# Patient Record
Sex: Male | Born: 1970 | Race: Black or African American | Hispanic: No | Marital: Single | Smoking: Former smoker
Health system: Southern US, Community
[De-identification: ages and names within clinical notes are randomized; demographics above are authoritative.]

## PROBLEM LIST (undated history)

## (undated) ENCOUNTER — Emergency Department (HOSPITAL_COMMUNITY): Discharge status: Absent without leave | Payer: Self-pay

## (undated) ENCOUNTER — Emergency Department (HOSPITAL_COMMUNITY): Admission: EM | Payer: Self-pay | Source: Home / Self Care

## (undated) DIAGNOSIS — Z720 Tobacco use: Secondary | ICD-10-CM

## (undated) DIAGNOSIS — I219 Acute myocardial infarction, unspecified: Secondary | ICD-10-CM

## (undated) HISTORY — DX: Acute myocardial infarction, unspecified: I21.9

---

## 2004-04-25 ENCOUNTER — Emergency Department (HOSPITAL_COMMUNITY): Admission: EM | Admit: 2004-04-25 | Discharge: 2004-04-25 | Payer: Self-pay | Admitting: Emergency Medicine

## 2011-09-27 ENCOUNTER — Emergency Department (HOSPITAL_COMMUNITY)
Admission: EM | Admit: 2011-09-27 | Discharge: 2011-09-27 | Disposition: A | Discharge status: Home / Self Care | Payer: Self-pay | Attending: Emergency Medicine | Admitting: Emergency Medicine

## 2011-09-27 ENCOUNTER — Emergency Department (HOSPITAL_COMMUNITY): Payer: Self-pay

## 2011-09-27 DIAGNOSIS — M79609 Pain in unspecified limb: Secondary | ICD-10-CM | POA: Insufficient documentation

## 2011-09-27 DIAGNOSIS — M25579 Pain in unspecified ankle and joints of unspecified foot: Secondary | ICD-10-CM | POA: Insufficient documentation

## 2011-09-27 DIAGNOSIS — S93409A Sprain of unspecified ligament of unspecified ankle, initial encounter: Secondary | ICD-10-CM | POA: Insufficient documentation

## 2011-09-27 DIAGNOSIS — M25473 Effusion, unspecified ankle: Secondary | ICD-10-CM | POA: Insufficient documentation

## 2011-09-27 DIAGNOSIS — M25476 Effusion, unspecified foot: Secondary | ICD-10-CM | POA: Insufficient documentation

## 2011-09-27 DIAGNOSIS — X500XXA Overexertion from strenuous movement or load, initial encounter: Secondary | ICD-10-CM | POA: Insufficient documentation

## 2012-01-03 ENCOUNTER — Emergency Department (INDEPENDENT_AMBULATORY_CARE_PROVIDER_SITE_OTHER)
Admission: EM | Admit: 2012-01-03 | Discharge: 2012-01-03 | Disposition: A | Discharge status: Home / Self Care | Payer: BC Managed Care – PPO | Source: Home / Self Care | Attending: Family Medicine | Admitting: Family Medicine

## 2012-01-03 ENCOUNTER — Encounter (HOSPITAL_COMMUNITY): Payer: Self-pay | Admitting: *Deleted

## 2012-01-03 DIAGNOSIS — R05 Cough: Secondary | ICD-10-CM

## 2012-01-03 DIAGNOSIS — J310 Chronic rhinitis: Secondary | ICD-10-CM

## 2012-01-03 MED ORDER — GUAIFENESIN-CODEINE 100-10 MG/5ML PO SYRP: 5.0000 mL | ORAL_SOLUTION | Freq: Four times a day (QID) | ORAL | Status: AC | PRN

## 2012-01-03 MED ORDER — FLUTICASONE PROPIONATE 50 MCG/ACT NA SUSP: 2.0000 | Freq: Every day | NASAL | Status: DC

## 2012-01-03 NOTE — ED Provider Notes (Signed)
History     CSN: [id number]  Arrival date & time 01/03/12  0909   First MD Initiated Contact with Patient 01/03/12 [phone]      Chief Complaint  Patient presents with  . URI    (Consider location/radiation/quality/duration/timing/severity/associated sxs/prior treatment) HPI Comments: Jack Salas presents for evaluation of 3 weeks of cough, nasal congestion, and sore throat. He reports trying over-the-counter remedies without much relief. He denies any fever. He denies any history of seasonal allergies. He reports that the cough is worse at night.  Patient is a 41 y.o. male presenting with cough. The history is provided by the patient.  Cough This is a recurrent problem. The current episode started more than 1 week ago. The problem occurs constantly. The problem has not changed since onset.The cough is non-productive. There has been no fever. Associated symptoms include rhinorrhea and sore throat. He has tried decongestants and cough syrup for the symptoms. The treatment provided no relief.    History reviewed. No pertinent past medical history.  History reviewed. No pertinent past surgical history.  No family history on file.  History  Substance Use Topics  . Smoking status: Current Everyday Smoker  . Smokeless tobacco: Not on file  . Alcohol Use: Yes     occassional      Review of Systems  Constitutional: Negative.   HENT: Positive for sore throat and rhinorrhea.   Eyes: Negative.   Respiratory: Positive for cough.   Cardiovascular: Negative.   Gastrointestinal: Negative.   Genitourinary: Negative.   Musculoskeletal: Negative.   Skin: Negative.   Neurological: Negative.     Allergies  Review of patient's allergies indicates no known allergies.  Home Medications   Current Outpatient Rx  Name Route Sig Dispense Refill  . FLUTICASONE PROPIONATE 50 MCG/ACT NA SUSP Nasal Place 2 sprays into the nose daily. 16 g 2  . GUAIFENESIN-CODEINE 100-10 MG/5ML PO SYRP Oral Take 5 mLs  by mouth every 6 (six) hours as needed for cough or congestion. 120 mL 0    BP 130/82  Pulse 75  Temp(Src) 98.2 F (36.8 C) (Oral)  Resp 16  SpO2 99%  Physical Exam  Nursing note and vitals reviewed. Constitutional: He is oriented to person, place, and time. He appears well-developed and well-nourished.  HENT:  Head: Normocephalic and atraumatic.  Right Ear: Tympanic membrane normal.  Left Ear: Tympanic membrane normal.  Mouth/Throat: Uvula is midline, oropharynx is clear and moist and mucous membranes are normal.  Eyes: EOM are normal.  Neck: Normal range of motion.  Pulmonary/Chest: Effort normal and breath sounds normal. He has no wheezes. He has no rhonchi.  Musculoskeletal: Normal range of motion.  Neurological: He is alert and oriented to person, place, and time.  Skin: Skin is warm and dry.  Psychiatric: His behavior is normal.    ED Course  Procedures (including critical care time)  Labs Reviewed - No data to display No results found.   1. Cough   2. Rhinitis       MDM  rx given for fluticasone and guaifenesin AC        Richardo Priest, MD 01/03/12 1024

## 2012-01-03 NOTE — ED Notes (Signed)
Pt c/o sinus congestion, headache, cough for 3 weeks.  Has tried different otc meds without relief.  Denies fever.

## 2013-11-13 ENCOUNTER — Encounter (HOSPITAL_COMMUNITY): Payer: Self-pay | Admitting: Emergency Medicine

## 2013-11-13 ENCOUNTER — Emergency Department (HOSPITAL_COMMUNITY)
Admission: EM | Admit: 2013-11-13 | Discharge: 2013-11-13 | Disposition: A | Discharge status: Home / Self Care | Payer: BC Managed Care – PPO | Attending: Emergency Medicine | Admitting: Emergency Medicine

## 2013-11-13 DIAGNOSIS — H01009 Unspecified blepharitis unspecified eye, unspecified eyelid: Secondary | ICD-10-CM | POA: Insufficient documentation

## 2013-11-13 DIAGNOSIS — H01006 Unspecified blepharitis left eye, unspecified eyelid: Secondary | ICD-10-CM

## 2013-11-13 DIAGNOSIS — F17200 Nicotine dependence, unspecified, uncomplicated: Secondary | ICD-10-CM | POA: Insufficient documentation

## 2013-11-13 MED ORDER — ERYTHROMYCIN 5 MG/GM OP OINT: TOPICAL_OINTMENT | OPHTHALMIC | Status: DC

## 2013-11-13 NOTE — Progress Notes (Signed)
[facility] provided pt with a list of primary care resources. Patient stated that he was pending insurance through his job.  °

## 2013-11-13 NOTE — ED Notes (Signed)
Pt states he has had L eyelid swelling for past 2 days. Denies blurred vision. States he does have eye drainage.

## 2013-11-13 NOTE — ED Provider Notes (Signed)
CSN: [id number]     Arrival date & time 11/13/13  1041 History   First MD Initiated Contact with Patient 11/13/13 1108     Chief Complaint  Patient presents with  . Facial Swelling   (Consider location/radiation/quality/duration/timing/severity/associated sxs/prior Treatment) HPI Pt reports itching and swelling of his left eyelid x 2 days.  He remembers rubbing his eyelid at one point but denies any other trauma.  Has tried warm and cool compresses without improvement.  Denies fevers, chills, body aches, eye pain, visual changes.  Denies any trauma to the eye or entry of FB.  Denies FB sensation.  Denies pain or tenderness of the eyelid.   History reviewed. No pertinent past medical history. History reviewed. No pertinent past surgical history. History reviewed. No pertinent family history. History  Substance Use Topics  . Smoking status: Current Every Day Smoker  . Smokeless tobacco: Not on file  . Alcohol Use: Yes     Comment: occassional    Review of Systems  Constitutional: Negative for fever and chills.  Eyes: Positive for itching (eyelid itching only). Negative for photophobia, pain, discharge, redness and visual disturbance.  Musculoskeletal: Negative for myalgias.  Skin: Negative for rash and wound.  Allergic/Immunologic: Negative for immunocompromised state.    Allergies  Review of patient's allergies indicates no known allergies.  Home Medications   Current Outpatient Rx  Name  Route  Sig  Dispense  Refill  . tetrahydrozoline (VISINE) 0.05 % ophthalmic solution   Left Eye   Place 2 drops into the left eye once.          BP 137/85  Pulse 84  Temp(Src) 98.2 F (36.8 C) (Oral)  Resp 20  SpO2 99% Physical Exam  Nursing note and vitals reviewed. Constitutional: He appears well-developed and well-nourished. No distress.  HENT:  Head: Normocephalic and atraumatic.  Eyes: Conjunctivae and EOM are normal. Pupils are equal, round, and reactive to light. Right  eye exhibits no discharge and no exudate. Left eye exhibits no discharge, no exudate and no hordeolum. Right conjunctiva is not injected. Right conjunctiva has no hemorrhage. Left conjunctiva is not injected. Left conjunctiva has no hemorrhage. No scleral icterus.  Left eye with mild edema, erythema along lid margin/base of the eyelashes.  Nontender, no discharge.  No masses.    Neck: Neck supple.  Pulmonary/Chest: Effort normal.  Neurological: He is alert.  Skin: He is not diaphoretic.    ED Course  Procedures (including critical care time) Labs Review Labs Reviewed - No data to display Imaging Review No results found.  EKG Interpretation   None       MDM   1. Blepharitis of left eye    Pt with mild swelling and itching to left eyelid without any complaints involving the eye (globe) itself.  No visual changes, no trauma to the eye.  The eyelid is not tender.  Pt is afebrile, nontoxic.  He has no systemic symptoms or URI-type complaints.  No e/o stye on exam.  D/C home with erythromycine ophth. Ointment, instructions for warm compresses.  Ophthalmology f/u given in the event of lack of improvement.  Discussed return precautions. Discussed findings, treatment, and follow up  with patient.  Pt given return precautions.  Pt verbalizes understanding and agrees with plan.      I doubt any other EMC precluding discharge at this time including, but not necessarily limited to the following: orbital or periorbital cellulitis, corneal abrasion, corneal FB, ruptured globe.  Trixie Dredge, PA-C 11/13/13 1148

## 2013-11-13 NOTE — ED Notes (Signed)
Ambulates without distress. alertx4.

## 2013-11-14 NOTE — ED Provider Notes (Signed)
Medical screening examination/treatment/procedure(s) were performed by non-physician practitioner and as supervising physician I was immediately available for consultation/collaboration.  EKG Interpretation   None          Christopher J. Pollina, MD 11/14/13 1403 

## 2014-10-18 ENCOUNTER — Inpatient Hospital Stay (HOSPITAL_COMMUNITY)
Admission: EM | Admit: 2014-10-18 | Discharge: 2014-10-20 | DRG: 247 | Disposition: A | Discharge status: Home / Self Care | Payer: BC Managed Care – PPO | Attending: Cardiology | Admitting: Cardiology

## 2014-10-18 ENCOUNTER — Emergency Department (HOSPITAL_COMMUNITY): Payer: BC Managed Care – PPO

## 2014-10-18 ENCOUNTER — Encounter (HOSPITAL_COMMUNITY)
Admission: EM | Disposition: A | Discharge status: Home / Self Care | Payer: BC Managed Care – PPO | Source: Home / Self Care | Attending: Cardiology

## 2014-10-18 ENCOUNTER — Encounter (HOSPITAL_COMMUNITY): Payer: Self-pay

## 2014-10-18 DIAGNOSIS — I249 Acute ischemic heart disease, unspecified: Secondary | ICD-10-CM | POA: Diagnosis present

## 2014-10-18 DIAGNOSIS — R0602 Shortness of breath: Secondary | ICD-10-CM

## 2014-10-18 DIAGNOSIS — Z8249 Family history of ischemic heart disease and other diseases of the circulatory system: Secondary | ICD-10-CM

## 2014-10-18 DIAGNOSIS — I252 Old myocardial infarction: Secondary | ICD-10-CM | POA: Diagnosis present

## 2014-10-18 DIAGNOSIS — Z955 Presence of coronary angioplasty implant and graft: Secondary | ICD-10-CM

## 2014-10-18 DIAGNOSIS — I2119 ST elevation (STEMI) myocardial infarction involving other coronary artery of inferior wall: Secondary | ICD-10-CM

## 2014-10-18 DIAGNOSIS — Z720 Tobacco use: Secondary | ICD-10-CM

## 2014-10-18 DIAGNOSIS — R079 Chest pain, unspecified: Secondary | ICD-10-CM

## 2014-10-18 DIAGNOSIS — F1210 Cannabis abuse, uncomplicated: Secondary | ICD-10-CM | POA: Diagnosis present

## 2014-10-18 DIAGNOSIS — E780 Pure hypercholesterolemia: Secondary | ICD-10-CM | POA: Diagnosis present

## 2014-10-18 DIAGNOSIS — I2109 ST elevation (STEMI) myocardial infarction involving other coronary artery of anterior wall: Secondary | ICD-10-CM | POA: Diagnosis present

## 2014-10-18 DIAGNOSIS — I10 Essential (primary) hypertension: Secondary | ICD-10-CM | POA: Diagnosis present

## 2014-10-18 HISTORY — PX: PERCUTANEOUS CORONARY STENT INTERVENTION (PCI-S): SHX5485

## 2014-10-18 HISTORY — PX: LEFT HEART CATHETERIZATION WITH CORONARY ANGIOGRAM: SHX5451

## 2014-10-18 LAB — APTT: APTT: 26 s (ref 24–37)

## 2014-10-18 LAB — BASIC METABOLIC PANEL
ANION GAP: 14 (ref 5–15)
BUN: 9 mg/dL (ref 6–23)
CHLORIDE: 104 meq/L (ref 96–112)
CO2: 24 mEq/L (ref 19–32)
Calcium: 9 mg/dL (ref 8.4–10.5)
Creatinine, Ser: 1.02 mg/dL (ref 0.50–1.35)
GFR calc Af Amer: >90 mL/min (ref >90)
GFR calc non Af Amer: 88 mL/min — ABNORMAL LOW (ref >90)
GLUCOSE: 146 mg/dL — AB (ref 70–99)
POTASSIUM: 4 meq/L (ref 3.7–5.3)
SODIUM: 142 meq/L (ref 137–147)

## 2014-10-18 LAB — CBC
HEMATOCRIT: 38 % — AB (ref 39.0–52.0)
Hemoglobin: 12.4 g/dL — ABNORMAL LOW (ref 13.0–17.0)
MCH: 24.2 pg — ABNORMAL LOW (ref 26.0–34.0)
MCHC: 32.6 g/dL (ref 30.0–36.0)
MCV: 74.1 fL — AB (ref 78.0–100.0)
Platelets: 347 10*3/uL (ref 150–400)
RBC: 5.13 MIL/uL (ref 4.22–5.81)
RDW: 14.2 % (ref 11.5–15.5)
WBC: 14.1 10*3/uL — ABNORMAL HIGH (ref 4.0–10.5)

## 2014-10-18 LAB — PRO B NATRIURETIC PEPTIDE: PRO B NATRI PEPTIDE: 33.8 pg/mL (ref 0–125)

## 2014-10-18 LAB — I-STAT TROPONIN, ED: Troponin i, poc: 0 ng/mL (ref 0.00–0.08)

## 2014-10-18 LAB — PROTIME-INR
INR: 0.97 (ref 0.00–1.49)
Prothrombin Time: 13 seconds (ref 11.6–15.2)

## 2014-10-18 SURGERY — LEFT HEART CATHETERIZATION WITH CORONARY ANGIOGRAM
Anesthesia: LOCAL

## 2014-10-18 MED ORDER — NITROGLYCERIN IN D5W 200-5 MCG/ML-% IV SOLN: 5.0000 ug/min | INTRAVENOUS | Status: DC

## 2014-10-18 MED ORDER — SODIUM CHLORIDE 0.9 % IV SOLN
INTRAVENOUS | Status: AC
  Administered 2014-10-18 – 2014-10-19 (×3): via INTRAVENOUS

## 2014-10-18 MED ORDER — BIVALIRUDIN 250 MG IV SOLR
INTRAVENOUS | Status: AC
  Filled 2014-10-18: qty 250

## 2014-10-18 MED ORDER — ASPIRIN 81 MG PO CHEW: 324.0000 mg | CHEWABLE_TABLET | ORAL | Status: AC

## 2014-10-18 MED ORDER — HEPARIN (PORCINE) IN NACL 100-0.45 UNIT/ML-% IJ SOLN
1300.0000 [IU]/h | INTRAMUSCULAR | Status: DC
  Filled 2014-10-18: qty 250

## 2014-10-18 MED ORDER — NITROGLYCERIN 0.4 MG SL SUBL: 0.4000 mg | SUBLINGUAL_TABLET | SUBLINGUAL | Status: DC | PRN

## 2014-10-18 MED ORDER — ASPIRIN 81 MG PO CHEW
324.0000 mg | CHEWABLE_TABLET | Freq: Once | ORAL | Status: AC
  Administered 2014-10-18: 324 mg via ORAL
  Filled 2014-10-18: qty 4

## 2014-10-18 MED ORDER — ONDANSETRON HCL 4 MG/2ML IJ SOLN: 4.0000 mg | Freq: Four times a day (QID) | INTRAMUSCULAR | Status: DC | PRN

## 2014-10-18 MED ORDER — ATORVASTATIN CALCIUM 80 MG PO TABS
80.0000 mg | ORAL_TABLET | Freq: Every day | ORAL | Status: DC
  Administered 2014-10-18 – 2014-10-19 (×2): 80 mg via ORAL
  Filled 2014-10-18 (×3): qty 1

## 2014-10-18 MED ORDER — PRASUGREL HCL 10 MG PO TABS
ORAL_TABLET | ORAL | Status: AC
  Administered 2014-10-19: 10 mg via ORAL
  Filled 2014-10-18: qty 6

## 2014-10-18 MED ORDER — NITROGLYCERIN IN D5W 200-5 MCG/ML-% IV SOLN
5.0000 ug/min | INTRAVENOUS | Status: DC
  Administered 2014-10-18: 15 ug/min via INTRAVENOUS

## 2014-10-18 MED ORDER — NITROGLYCERIN 1 MG/10 ML FOR IR/CATH LAB
INTRA_ARTERIAL | Status: AC
Start: 2014-10-18 — End: 2014-10-18
  Filled 2014-10-18: qty 10

## 2014-10-18 MED ORDER — FENTANYL CITRATE 0.05 MG/ML IJ SOLN
INTRAMUSCULAR | Status: AC
  Filled 2014-10-18: qty 2

## 2014-10-18 MED ORDER — ACETAMINOPHEN 325 MG PO TABS: 650.0000 mg | ORAL_TABLET | ORAL | Status: DC | PRN

## 2014-10-18 MED ORDER — ASPIRIN EC 81 MG PO TBEC
81.0000 mg | DELAYED_RELEASE_TABLET | Freq: Every day | ORAL | Status: DC
  Administered 2014-10-19 – 2014-10-20 (×2): 81 mg via ORAL
  Filled 2014-10-18 (×2): qty 1

## 2014-10-18 MED ORDER — TIROFIBAN HCL IV 5 MG/100ML
0.1500 ug/kg/min | INTRAVENOUS | Status: DC
  Administered 2014-10-18 – 2014-10-19 (×2): 0.15 ug/kg/min via INTRAVENOUS
  Filled 2014-10-18 (×3): qty 100

## 2014-10-18 MED ORDER — PRASUGREL HCL 10 MG PO TABS
10.0000 mg | ORAL_TABLET | Freq: Every day | ORAL | Status: DC
  Administered 2014-10-19 – 2014-10-20 (×2): 10 mg via ORAL
  Filled 2014-10-18 (×2): qty 1

## 2014-10-18 MED ORDER — HEPARIN SODIUM (PORCINE) 5000 UNIT/ML IJ SOLN: 60.0000 [IU]/kg | INTRAMUSCULAR | Status: DC

## 2014-10-18 MED ORDER — ASPIRIN 300 MG RE SUPP
300.0000 mg | RECTAL | Status: AC
  Filled 2014-10-18: qty 1

## 2014-10-18 MED ORDER — TIROFIBAN HCL IV 12.5 MG/250 ML
INTRAVENOUS | Status: AC
  Filled 2014-10-18: qty 250

## 2014-10-18 MED ORDER — LIDOCAINE HCL (PF) 1 % IJ SOLN
INTRAMUSCULAR | Status: AC
  Filled 2014-10-18: qty 30

## 2014-10-18 MED ORDER — MORPHINE SULFATE 4 MG/ML IJ SOLN
4.0000 mg | Freq: Once | INTRAMUSCULAR | Status: AC
  Administered 2014-10-18: 4 mg via INTRAVENOUS
  Filled 2014-10-18: qty 1

## 2014-10-18 MED ORDER — PNEUMOCOCCAL VAC POLYVALENT 25 MCG/0.5ML IJ INJ
0.5000 mL | INJECTION | INTRAMUSCULAR | Status: DC
  Filled 2014-10-18: qty 0.5

## 2014-10-18 MED ORDER — NITROGLYCERIN IN D5W 200-5 MCG/ML-% IV SOLN
INTRAVENOUS | Status: AC
  Filled 2014-10-18: qty 250

## 2014-10-18 MED ORDER — SODIUM CHLORIDE 0.9 % IV SOLN
INTRAVENOUS | Status: DC
  Administered 2014-10-18: 20:00:00 via INTRAVENOUS

## 2014-10-18 MED ORDER — METOPROLOL TARTRATE 12.5 MG HALF TABLET
12.5000 mg | ORAL_TABLET | Freq: Two times a day (BID) | ORAL | Status: DC
  Administered 2014-10-18 – 2014-10-20 (×4): 12.5 mg via ORAL
  Filled 2014-10-18 (×5): qty 1

## 2014-10-18 MED ORDER — INFLUENZA VAC SPLIT QUAD 0.5 ML IM SUSY
0.5000 mL | PREFILLED_SYRINGE | INTRAMUSCULAR | Status: AC
  Administered 2014-10-19: 0.5 mL via INTRAMUSCULAR
  Filled 2014-10-18: qty 0.5

## 2014-10-18 MED ORDER — ALPRAZOLAM 0.25 MG PO TABS
0.2500 mg | ORAL_TABLET | Freq: Three times a day (TID) | ORAL | Status: DC | PRN
  Administered 2014-10-19 (×2): 0.25 mg via ORAL
  Filled 2014-10-18 (×2): qty 1

## 2014-10-18 MED ORDER — FENTANYL CITRATE 0.05 MG/ML IJ SOLN
100.0000 ug | Freq: Once | INTRAMUSCULAR | Status: AC
  Administered 2014-10-18: 100 ug via INTRAVENOUS

## 2014-10-18 MED ORDER — ASPIRIN 81 MG PO CHEW
81.0000 mg | CHEWABLE_TABLET | Freq: Every day | ORAL | Status: DC
  Administered 2014-10-20: 81 mg via ORAL
  Filled 2014-10-18: qty 1

## 2014-10-18 MED ORDER — HEPARIN BOLUS VIA INFUSION
4000.0000 [IU] | Freq: Once | INTRAVENOUS | Status: DC
  Filled 2014-10-18: qty 4000

## 2014-10-18 MED ORDER — HEPARIN (PORCINE) IN NACL 2-0.9 UNIT/ML-% IJ SOLN
INTRAMUSCULAR | Status: AC
  Filled 2014-10-18: qty 1500

## 2014-10-18 NOTE — Progress Notes (Signed)
Chaplain provided support to family by escorting them to waiting area outside Cath Lab.  Stemi team informed of family location.  Emotional and spiritual support provided.  Chaplain will follow up as needed.   10/18/14 2100  Clinical Encounter Type  Visited With Family;Patient not available  Visit Type Initial;Spiritual support;Social support;Patient in surgery;Critical Care  Referral From Care management  Spiritual Encounters  Spiritual Needs Emotional  Stress Factors  Patient Stress Factors None identified  Family Stress Factors Exhausted;Health changes  Erroll LunaOvercash, Leobardo Granlund A, Chaplain

## 2014-10-18 NOTE — H&P (Signed)
Jack Salas is an 43 y.o. male.   Chief Complaint: Chest pain  HPI: Patient is 43 year old male with no significant past medical history except for tobacco abuse, marijuana abuse and strong family history of coronary artery disease father died of massive MI at the age of 81., Came to ER by family at [facility] complaining of retrosternal chest pain described as pressure tightness radiating to right shoulder right arm associated with diaphoresis shortness of breath nausea grade 8/10 while watching football game. EKG done in the ER showed normal sinus rhythm with septal Q waves and ST elevation concave upward in lead 23 aVF V5 and V6 without significant reciprocal changes "STEMI was called and patient was transferred to [facility] cath lab. Patient does give history of exertional chest pain while at work while lifting boxes relieves with rest for last few days. Patient did not seek any medical attention in the past. Patient denies any cocaine abuse. Patient received a IV heparin and aspirin 8 mg IV morphine with partial relief of chest pain and was transferred emergently to [facility].  History reviewed. No pertinent past medical history.  History reviewed. No pertinent past surgical history.  No family history on file. Social History:  reports that he has been smoking.  He does not have any smokeless tobacco history on file. He reports that he drinks alcohol. He reports that he does not use illicit drugs.  Allergies: No Known Allergies  Medications Prior to Admission  Medication Sig Dispense Refill  . erythromycin ophthalmic ointment Place a 1/2 inch ribbon of ointment into the lower eyelid four times daily for 2 weeks, then once qHS for 4-8 weeks. 3.5 g 0  . tetrahydrozoline (VISINE) 0.05 % ophthalmic solution Place 2 drops into the left eye once.      Results for orders placed or performed during the hospital encounter of 10/18/14 (from the past 48 hour(s))  CBC      Status: Abnormal   Collection Time: 10/18/14  8:08 PM  Result Value Ref Range   WBC 14.1 (H) 4.0 - 10.5 K/uL   RBC 5.13 4.22 - 5.81 MIL/uL   Hemoglobin 12.4 (L) 13.0 - 17.0 g/dL   HCT 38.0 (L) 39.0 - 52.0 %   MCV 74.1 (L) 78.0 - 100.0 fL   MCH 24.2 (L) 26.0 - 34.0 pg   MCHC 32.6 30.0 - 36.0 g/dL   RDW 14.2 11.5 - 15.5 %   Platelets 347 150 - 400 K/uL  Basic metabolic panel     Status: Abnormal   Collection Time: 10/18/14  8:08 PM  Result Value Ref Range   Sodium 142 137 - 147 mEq/L   Potassium 4.0 3.7 - 5.3 mEq/L   Chloride 104 96 - 112 mEq/L   CO2 24 19 - 32 mEq/L   Glucose, Bld 146 (H) 70 - 99 mg/dL   BUN 9 6 - 23 mg/dL   Creatinine, Ser 1.02 0.50 - 1.35 mg/dL   Calcium 9.0 8.4 - 10.5 mg/dL   GFR calc non Af Amer 88 (L) >90 mL/min   GFR calc Af Amer >90 >90 mL/min    Comment: (NOTE) The eGFR has been calculated using the CKD EPI equation. This calculation has not been validated in all clinical situations. eGFR's persistently <90 mL/min signify possible Chronic Kidney Disease.    Anion gap 14 5 - 15  Pro b natriuretic peptide (BNP)     Status: None   Collection Time: 10/18/14  8:08 PM  Result Value Ref Range   Pro B Natriuretic peptide (BNP) 33.8 0 - 125 pg/mL  APTT     Status: None   Collection Time: 10/18/14  8:09 PM  Result Value Ref Range   aPTT 26 24 - 37 seconds  Protime-INR     Status: None   Collection Time: 10/18/14  8:09 PM  Result Value Ref Range   Prothrombin Time 13.0 11.6 - 15.2 seconds   INR 0.97 0.00 - 1.49  I-stat troponin, ED (not at [facility])     Status: None   Collection Time: 10/18/14  8:17 PM  Result Value Ref Range   Troponin i, poc 0.00 0.00 - 0.08 ng/mL   Comment 3            Comment: Due to the release kinetics of cTnI, a negative result within the first hours of the onset of symptoms does not rule out myocardial infarction with certainty. If myocardial infarction is still suspected, repeat the test at appropriate intervals.    Dg Chest  Port 1 View  10/18/2014   CLINICAL DATA:  Centralized chest pain beginning today, associated with shortness of breath. Intermittent symptoms for 1 month.  EXAM: PORTABLE CHEST - 1 VIEW  COMPARISON:  None.  FINDINGS: Cardiomediastinal silhouette is unremarkable. Mild prominence of central bronchovascular markings without pleural effusions or focal consolidations. No pneumothorax. Soft tissue planes and included osseous structures are unremarkable.  IMPRESSION: Mild prominence of central bronchovascular markings can be seen with reactive airway disease or bronchitis without focal consolidation.   Electronically Signed   By: Elon Alas   On: 10/18/2014 20:33    Review of Systems  Constitutional: Positive for diaphoresis. Negative for fever, chills and weight loss.  Eyes: Negative for blurred vision, double vision, photophobia and pain.  Respiratory: Positive for shortness of breath. Negative for cough, hemoptysis and sputum production.   Cardiovascular: Positive for chest pain. Negative for palpitations, orthopnea, claudication, leg swelling and PND.  Gastrointestinal: Positive for nausea. Negative for heartburn, diarrhea and constipation.  Genitourinary: Positive for dysuria. Negative for urgency.  Neurological: Positive for headaches. Negative for dizziness.    Blood pressure 161/98, pulse 79, temperature 97.5 F (36.4 C), temperature source Oral, resp. rate 19, height $RemoveBe'5\' 11"'rgXvnuRwV$  (1.803 m), weight 113.399 kg (250 lb), SpO2 100 %. Physical Exam  Constitutional: He is oriented to person, place, and time.  HENT:  Head: Normocephalic and atraumatic.  Eyes: Conjunctivae and EOM are normal.  Neck: Neck supple. No JVD present. No tracheal deviation present. No thyromegaly present.  Cardiovascular: Normal rate and regular rhythm.   Murmur (Soft systolic murmur and S4 gallop noted) heard. Respiratory: Effort normal and breath sounds normal. No respiratory distress. He has no wheezes. He has no  rales.  GI: Soft. Bowel sounds are normal. He exhibits no distension. There is no tenderness. There is no rebound.  Musculoskeletal: He exhibits no edema or tenderness.  Neurological: He is alert and oriented to person, place, and time.     Assessment/Plan Acute coronary syndrome rule out MI New-onset hypertension Tobacco abuse Marijuana abuse Positive family history of coronary artery disease Plan Discussed with patient briefly regarding emergency left cath possible PTCA stenting this risk and benefits and consents for PCI  [facility] 10/18/2014, 10:07 PM

## 2014-10-18 NOTE — ED Notes (Signed)
Belongings given to spouse

## 2014-10-18 NOTE — ED Provider Notes (Signed)
Await cath lab arrival; patient with ongoing moderately severe chest pressure not sharp stabbing pain not relieved with heparin drip after heparin bolus and aspirin as well as morphine prior to arrival; fentanyl ordered in ED; repeat ECG reveals slight persistent ST elevation inferior leads without reciprocal ST depression. 2050  Jack HornJohn M Jaira Canady, MD 10/22/14 [phone]

## 2014-10-18 NOTE — Progress Notes (Signed)
ANTICOAGULATION CONSULT NOTE - Initial Consult  Pharmacy Consult for heparin Indication: chest pain/ACS  No Known Allergies  Patient Measurements: Height: 5\' 11"  (180.3 cm) Weight: 250 lb (113.399 kg) IBW/kg (Calculated) : 75.3 Heparin Dosing Weight: 99 kg   Vital Signs: Temp: 97.5 F (36.4 C) (11/22 1952) Temp Source: Oral (11/22 1952) BP: 150/97 mmHg (11/22 1952) Pulse Rate: 79 (11/22 1952)  Labs: No results for input(s): HGB, HCT, PLT, APTT, LABPROT, INR, HEPARINUNFRC, CREATININE, CKTOTAL, CKMB, TROPONINI in the last 72 hours.  CrCl cannot be calculated (Patient has no serum creatinine result on file.).   Medical History: History reviewed. No pertinent past medical history.  Medications:   (Not in a hospital admission)  Assessment: Pharmacy consulted to start heparin on [age] YOM who presented with chest pain earlier today now ruled as having STEMI. Awaiting emergent cath. He is not on any anticoagulants prior to admission.   Goal of Therapy:  Heparin level 0.3-0.7 units/ml Monitor platelets by anticoagulation protocol: Yes   Plan:  -Give heparin 4000 units bolus followed by heparin 1300 units/hr -F/u after cath  -Monitor daily HL, CBC and s/s of bleeding  Vinnie LevelBenjamin Hubbert Landrigan, PharmD.  Clinical Pharmacist Pager [phone]

## 2014-10-18 NOTE — ED Notes (Signed)
Tiffany, RN gave HEPARIN to [facility].

## 2014-10-18 NOTE — ED Provider Notes (Signed)
Pt seen with H. Muthersbaugh, PA.  C/o SSCP starting 1 hour ago. Diaphoresis.  Smoker. Father died of MI at 44 y/o.  Pt awake.  HR 89bpm, sinus, BP 150/.  EKG with STE II,III, F, and STE with Hyperacute T V4-V6. D/W Dr. Sharyn LullHarwani, Cardiology, and Dr. Fonnie JarvisBednar (ED).  Given ASA, Heparin bolus, emergent transfer arranged.     Rolland PorterMark Vincen Bejar, MD 10/18/14 2024

## 2014-10-18 NOTE — ED Provider Notes (Signed)
CSN: [id number]     Arrival date & time 10/18/14  1945 History   First MD Initiated Contact with Patient 10/18/14 1958     Chief Complaint  Patient presents with  . Chest Pain     (Consider location/radiation/quality/duration/timing/severity/associated sxs/prior Treatment) Patient is a 43 y.o. male presenting with chest pain. The history is provided by the patient, the spouse and medical records. No language interpreter was used.  Chest Pain Associated symptoms: diaphoresis and shortness of breath   Associated symptoms: no abdominal pain, no back pain, no cough, no fatigue, no fever, no headache, no nausea and not vomiting     Glendale ChardObra Baumler is a 43 y.o. male  with no known medical history or allergies presents to the Emergency Department complaining of gradual, persistent, progressively worsening substernal chest pain with associated dizziness and diaphoresis beginning approximately 7:15 tonight while watching a football game. Patient reports he is in everyday smoker of approximately 5 cigarettes. He states marijuana usage but denies cocaine usage. He drinks occasional alcohol and had 0.5 beers tonight.  Patient reports he developed a headache, became dizzy and walk to the bathroom where he had diaphoresis and chest pain. Patient scheduled for chest pain as aching and heavy. He reports that it radiates to his back and his right arm feels "strange."   Patient denies radiation of pain to his jaw or left arm. Patient also reports nausea without vomiting. His no history of MI, hypertension, diabetes, or known high cholesterol.  Patient reports he's been intermittently having shortness of breath and chest pain for approximately one month.  History reviewed. No pertinent past medical history. History reviewed. No pertinent past surgical history. No family history on file. History  Substance Use Topics  . Smoking status: Current Every Day Smoker  . Smokeless tobacco: Not on file  . Alcohol Use: Yes      Comment: occassional    Review of Systems  Constitutional: Positive for diaphoresis. Negative for fever, appetite change, fatigue and unexpected weight change.  HENT: Negative for mouth sores.   Eyes: Negative for visual disturbance.  Respiratory: Positive for shortness of breath. Negative for cough, chest tightness and wheezing.   Cardiovascular: Positive for chest pain.  Gastrointestinal: Negative for nausea, vomiting, abdominal pain, diarrhea and constipation.  Endocrine: Negative for polydipsia, polyphagia and polyuria.  Genitourinary: Negative for dysuria, urgency, frequency and hematuria.  Musculoskeletal: Negative for back pain and neck stiffness.  Skin: Negative for rash.  Allergic/Immunologic: Negative for immunocompromised state.  Neurological: Negative for syncope, light-headedness and headaches.  Hematological: Does not bruise/bleed easily.  Psychiatric/Behavioral: Negative for sleep disturbance. The patient is not nervous/anxious.       Allergies  Review of patient's allergies indicates no known allergies.  Home Medications   Prior to Admission medications   Medication Sig Start Date End Date Taking? Authorizing Provider  erythromycin ophthalmic ointment Place a 1/2 inch ribbon of ointment into the lower eyelid four times daily for 2 weeks, then once qHS for 4-8 weeks. 11/13/13   Trixie DredgeEmily West, PA-C  tetrahydrozoline (VISINE) 0.05 % ophthalmic solution Place 2 drops into the left eye once.    Historical Provider, MD   BP 155/98 mmHg  Pulse 79  Temp(Src) 97.5 F (36.4 C) (Oral)  Resp 15  Ht 5\' 11"  (1.803 m)  Wt 250 lb (113.399 kg)  BMI 34.88 kg/m2  SpO2 99% Physical Exam  Constitutional: He appears well-developed and well-nourished. No distress.  Awake, alert, nontoxic appearance  HENT:  Head: Normocephalic  and atraumatic.  Mouth/Throat: Oropharynx is clear and moist. No oropharyngeal exudate.  Eyes: Conjunctivae are normal. No scleral icterus.  Neck:  Normal range of motion. Neck supple.  Cardiovascular: Normal rate, regular rhythm, normal heart sounds and intact distal pulses.   No murmur heard. No tachycardia No murmur  Pulmonary/Chest: Effort normal and breath sounds normal. No respiratory distress. He has no wheezes.  Equal chest expansion  Abdominal: Soft. Bowel sounds are normal. He exhibits no distension and no mass. There is no tenderness. There is no rebound and no guarding.  Abd soft and nontender  Musculoskeletal: Normal range of motion. He exhibits no edema.  No peripheral edema  Neurological: He is alert. He exhibits normal muscle tone. Coordination normal.  Speech is clear and goal oriented Moves extremities without ataxia  Skin: Skin is warm and dry. He is not diaphoretic.  Psychiatric: He has a normal mood and affect.  Nursing note and vitals reviewed.   ED Course  Procedures (including critical care time) Labs Review Labs Reviewed  CBC  BASIC METABOLIC PANEL  PRO B NATRIURETIC PEPTIDE  APTT  PROTIME-INR  I-STAT TROPOININ, ED    Imaging Review No results found.   EKG Interpretation   Date/Time:  Sunday October 18 2014 20:00:47 EST Ventricular Rate:  75 PR Interval:  160 QRS Duration: 79 QT Interval:  398 QTC Calculation: 444 R Axis:   72 Text Interpretation:  Sinus arrhythmia Inferior infarct, acute (LCx)  Anterior infarct, old Lateral leads are also involved Confirmed by Fayrene FearingJAMES   MD, MARK ([id number]) on 10/18/2014 8:11:48 PM       CRITICAL CARE Performed by: Dierdre ForthMuthersbaugh, Jonaven Hilgers Total critical care time: 30min Critical care time was exclusive of separately billable procedures and treating other patients. Critical care was necessary to treat or prevent imminent or life-threatening deterioration. Critical care was time spent personally by me on the following activities: development of treatment plan with patient and/or surrogate as well as nursing, discussions with consultants, evaluation of  patient's response to treatment, examination of patient, obtaining history from patient or surrogate, ordering and performing treatments and interventions, ordering and review of laboratory studies, ordering and review of radiographic studies, pulse oximetry and re-evaluation of patient's condition.   MDM   Final diagnoses:  Chest pain  SOB (shortness of breath)  Acute myocardial infarction of inferior wall   Glendale Chardbra Truby presents with chest pain onset approximately one hour prior to arrival. EKG with STEMI, inferior ST elevation.    Patient seen and evaluated in conjunction with Dr. Rolland PorterMark James.  STEMI called and Pt discussed with Dr. Sharyn LullHarwani and Dr. Wayland SalinasJohn Bednar who have accepted the patient.    BP 155/98 mmHg  Pulse 79  Temp(Src) 97.5 F (36.4 C) (Oral)  Resp 15  Ht 5\' 11"  (1.803 m)  Wt 250 lb (113.399 kg)  BMI 34.88 kg/m2  SpO2 99%   8:27 PM Pt with increasing chest pain and SOB.  Will give Morphine and hold nitro as pt is having an inferior MI.    8:32 PM [facility]    Dahlia ClientHannah Paije Goodhart, PA-C 10/18/14 2033  Rolland PorterMark James, MD 10/22/14 [phone]

## 2014-10-18 NOTE — ED Notes (Signed)
Pt presents with c/o centralized chest pain that started earlier today. Pt reports the that pain feels crushing in nature and he has some associated shortness of breath. Pt reports he has been having the shortness of breath off and on for about a month. Pt says the pain radiates to his back as well.

## 2014-10-18 NOTE — CV Procedure (Signed)
Left cardiac cath/PTCA stenting report dictated on 10/18/2014 dictation number is [id number]

## 2014-10-18 NOTE — ED Notes (Signed)
Placed patient on 2L of oxygen via [place].

## 2014-10-18 NOTE — Progress Notes (Signed)
eLink Physician-Brief Progress Note Patient Name: Glendale ChardObra Olkowski DOB: 07/26/1971 MRN: [id number]   Date of Service  10/18/2014  HPI/Events of Note  STEMI, s/p PCI HD stable Comfortable per camera  eICU Interventions  No eICU interventions     Intervention Category Evaluation Type: New Patient Evaluation  MCQUAID, DOUGLAS 10/18/2014, 11:21 PM

## 2014-10-19 LAB — COMPREHENSIVE METABOLIC PANEL
ALBUMIN: 3 g/dL — AB (ref 3.5–5.2)
ALT: 22 U/L (ref 0–53)
AST: 16 U/L (ref 0–37)
Alkaline Phosphatase: 79 U/L (ref 39–117)
Anion gap: 15 (ref 5–15)
BUN: 8 mg/dL (ref 6–23)
CALCIUM: 8.2 mg/dL — AB (ref 8.4–10.5)
CO2: 19 meq/L (ref 19–32)
Chloride: 104 mEq/L (ref 96–112)
Creatinine, Ser: 0.84 mg/dL (ref 0.50–1.35)
GFR calc Af Amer: >90 mL/min (ref >90)
Glucose, Bld: 98 mg/dL (ref 70–99)
Potassium: 4 mEq/L (ref 3.7–5.3)
Sodium: 138 mEq/L (ref 137–147)
Total Bilirubin: <0.2 mg/dL — ABNORMAL LOW (ref 0.3–1.2)
Total Protein: 6.3 g/dL (ref 6.0–8.3)

## 2014-10-19 LAB — CBC
HCT: 34.2 % — ABNORMAL LOW (ref 39.0–52.0)
Hemoglobin: 11 g/dL — ABNORMAL LOW (ref 13.0–17.0)
MCH: 24.1 pg — AB (ref 26.0–34.0)
MCHC: 32.2 g/dL (ref 30.0–36.0)
MCV: 75 fL — AB (ref 78.0–100.0)
Platelets: 319 10*3/uL (ref 150–400)
RBC: 4.56 MIL/uL (ref 4.22–5.81)
RDW: 14.4 % (ref 11.5–15.5)
WBC: 11.7 10*3/uL — ABNORMAL HIGH (ref 4.0–10.5)

## 2014-10-19 LAB — CBC WITH DIFFERENTIAL/PLATELET
Basophils Absolute: 0 10*3/uL (ref 0.0–0.1)
Basophils Relative: 0 % (ref 0–1)
Eosinophils Absolute: 0 10*3/uL (ref 0.0–0.7)
Eosinophils Relative: 0 % (ref 0–5)
HEMATOCRIT: 34.3 % — AB (ref 39.0–52.0)
Hemoglobin: 11.1 g/dL — ABNORMAL LOW (ref 13.0–17.0)
Lymphocytes Relative: 25 % (ref 12–46)
Lymphs Abs: 3 10*3/uL (ref 0.7–4.0)
MCH: 23.6 pg — ABNORMAL LOW (ref 26.0–34.0)
MCHC: 32.4 g/dL (ref 30.0–36.0)
MCV: 72.8 fL — ABNORMAL LOW (ref 78.0–100.0)
MONOS PCT: 7 % (ref 3–12)
Monocytes Absolute: 0.8 10*3/uL (ref 0.1–1.0)
NEUTROS ABS: 8 10*3/uL — AB (ref 1.7–7.7)
Neutrophils Relative %: 68 % (ref 43–77)
Platelets: 332 10*3/uL (ref 150–400)
RBC: 4.71 MIL/uL (ref 4.22–5.81)
RDW: 14 % (ref 11.5–15.5)
WBC: 11.8 10*3/uL — ABNORMAL HIGH (ref 4.0–10.5)

## 2014-10-19 LAB — LIPID PANEL
Cholesterol: 167 mg/dL (ref 0–200)
HDL: 48 mg/dL (ref >39)
LDL Cholesterol: 92 mg/dL (ref 0–99)
Total CHOL/HDL Ratio: 3.5 RATIO
Triglycerides: 134 mg/dL (ref <150)
VLDL: 27 mg/dL (ref 0–40)

## 2014-10-19 LAB — TROPONIN I
TROPONIN I: 1.62 ng/mL — AB (ref <0.30)
TROPONIN I: 2.67 ng/mL — AB (ref <0.30)
Troponin I: 1.47 ng/mL (ref <0.30)

## 2014-10-19 LAB — POCT ACTIVATED CLOTTING TIME
Activated Clotting Time: 129 seconds
Activated Clotting Time: 371 seconds

## 2014-10-19 LAB — MAGNESIUM: MAGNESIUM: 1.9 mg/dL (ref 1.5–2.5)

## 2014-10-19 LAB — BASIC METABOLIC PANEL
Anion gap: 16 — ABNORMAL HIGH (ref 5–15)
BUN: 8 mg/dL (ref 6–23)
CALCIUM: 8.1 mg/dL — AB (ref 8.4–10.5)
CO2: 19 mEq/L (ref 19–32)
Chloride: 105 mEq/L (ref 96–112)
Creatinine, Ser: 0.83 mg/dL (ref 0.50–1.35)
GFR calc Af Amer: >90 mL/min (ref >90)
GFR calc non Af Amer: >90 mL/min (ref >90)
GLUCOSE: 106 mg/dL — AB (ref 70–99)
Potassium: 3.8 mEq/L (ref 3.7–5.3)
Sodium: 140 mEq/L (ref 137–147)

## 2014-10-19 LAB — MRSA PCR SCREENING: MRSA BY PCR: NEGATIVE

## 2014-10-19 MED ORDER — ATROPINE SULFATE 0.1 MG/ML IJ SOLN
INTRAMUSCULAR | Status: AC
  Filled 2014-10-19: qty 10

## 2014-10-19 MED ORDER — LISINOPRIL 10 MG PO TABS
10.0000 mg | ORAL_TABLET | Freq: Every day | ORAL | Status: DC
  Administered 2014-10-19 – 2014-10-20 (×2): 10 mg via ORAL
  Filled 2014-10-19 (×2): qty 1

## 2014-10-19 MED FILL — Sodium Chloride IV Soln 0.9%: INTRAVENOUS | Qty: 50 | Status: AC

## 2014-10-19 NOTE — Op Note (Signed)
[name]:  Thielen, Xiong                ACCOUNT NO.:  [id number]  MEDICAL RECORD NO.:  [id number]  LOCATION:  2H10C                        FACILITY:  [facility]  PHYSICIAN:  Xolani Degracia N. Sharyn LullHarwani, M.D. DATE OF BIRTH:  [birth year]  DATE OF PROCEDURE:  10/18/2014 DATE OF DISCHARGE:                              OPERATIVE REPORT   PROCEDURE:  Left cardiac cath with selective left and right coronary angiography, LV graphy via right groin using Judkins technique.  INDICATION FOR THE PROCEDURE:  Mr. Mitzie NaBigelow is a 43 year old male with no significant past medical history except for tobacco abuse,  marijuana abuse and strong family history of coronary artery disease.  Father died of massive MI at the age of 44.  He came to the ER by a family at [facility] ER complaining of retrosternal chest pain described as pressure, tightness, radiating to the right shoulder, right arm associated with diaphoresis, shortness of breath, nausea, grade 8/10 while watching football game.  EKG done in the ED showed normal sinus rhythm with septal Q-waves with ST elevation, concave upward in leads 2, 3 aVF, V5 and V6 without significant reciprocal changes.  STEMI was called and the patient was transferred to [facility]. The patient does give history of exertional chest pain while at work while lifting boxes, relieves with rest for the last few days.  The patient did not seek any medical attention in the past.  The patient denies any cocaine abuse.  The patient received IV heparin, aspirin and 8 mg of IV morphine with partial relief of chest pain, was referred emergently to [facility].  Due to typical anginal chest pain, the patient had strong family history and minor EKG changes.  The patient was brought to [facility] emergently for possible PCI.  After obtaining the informed consent, the patient was placed on fluoroscopy table.  Right groin was prepped and draped in usual  fashion.  A 1% Xylocaine was used for local anesthesia in the right groin.  With the help of thin wall needle, 6-French arterial sheath was placed.  The sheath was aspirated and flushed.  Next, 6- French left Judkins catheter was advanced over the wire under fluoroscopic guidance up to the ascending aorta.  Wire was pulled out. The catheter was aspirated and connected to the Manifold.  Catheter was further advanced and engaged into left coronary ostium.  Multiple views of the left system were taken.  Next, catheter was disengaged and was pulled out over the wire and was replaced with 6-French right Judkins catheter, which was advanced over the wire under fluoroscopic guidance up to the ascending aorta.  Wire was pulled out.  The catheter was aspirated and connected to the Manifold.  Catheter was further advanced and attempted to engage into right coronary ostium without success. This catheter was exchanged to 6-French left Amplatz.  Diagnostic catheter which was advanced over the wire under fluoroscopic guidance up to the ascending aorta.  Wire was pulled out.  The catheter was aspirated and connected to the Manifold.  Catheter was further advanced and engaged into the right coronary ostium.  A single view of right coronary artery  was obtained.  Next, catheter was disengaged and was pulled out over the wire and was replaced with 6-French pigtail catheter at the end of the procedure, which was advanced over the wire under fluoroscopic guidance up to the ascending aorta.  Wire was pulled out. The catheter was aspirated and connected to the Manifold.  Catheter was further advanced across the aortic valve into the LV.  LV pressures were recorded.  Next, LV graft was done in 30-degree RAO position.  Post- angiographic pressures were recorded from LV and then pullback pressures were recorded from the aorta.  There was no gradient across the aortic valve.  Next, pigtail catheter was pulled out  over the wire.  Sheaths were aspirated and flushed.  FINDINGS:  LV showed mild anteroapical wall hypokinesia.  EF of 50-55%. Left main was patent.  The LAD has 80% proximal stenosis with ruptured plaque and thrombus with TIMI-3 distal flow.  Diagonal 1 was small which was patent.  Diagonal 2 was moderate sized which was patent.  Diagonal 3 was very small which was patent.  Ramus was moderate sized which was patent.  Left circumflex was patent.  OM1 and OM2 were very, very small. RCA has which was patent.  PDA and PLV branches were patent.  INTERVENTIONAL PROCEDURE:  Successful PTCA to proximal LAD was done using 3.0 x 12 mm long Emerge balloon for predilatation  and then 3.5 x 23 mm long Xience Alpine drug-eluting stent was deployed at 11 atmospheric pressure in proximal LAD.  This stent was post dilated using 4.0 x 12 mm long Delta Emerge balloon going up to 18 atmospheric pressure. Lesion dilated from 80-85% to 0% residual with excellent TIMI grade 3 distal flow without evidence of dissection or distal embolization.  The patient received weight based Angiomax, 60 mg of prasugrel and Aggrastat IV.  During the procedure, the patient tolerated the procedure well. There were no complications.  The patient was transferred to CCU in stable condition.     Eduardo OsierMohan N. Sharyn LullHarwani, M.D.     MNH/MEDQ  D:  10/18/2014  T:  10/18/2014  Job:  [id number]

## 2014-10-19 NOTE — Progress Notes (Signed)
CARDIAC REHAB PHASE I   PRE:  Rate/Rhythm: 71 SR    BP: sitting 145/88    SaO2: 98 RA  MODE:  Ambulation: 350 ft   POST:  Rate/Rhythm: 90 SR    BP: sitting 141/97     SaO2:   Tolerated well, thankful to be walking. BP elevated. [name] began with pt and wife. Very receptive. Plans to quit smoking. Interested in CRPII and will send referral to [place]. Will need to investigate financial assistance. Will f/u am for rest of ed. [phone]   Elissa LovettReeve, Ulanda Tackett La GrangeKristan CES, ACSM 10/19/2014 12:08 PM

## 2014-10-19 NOTE — Plan of Care (Signed)
Problem: Consults Goal: Tobacco Cessation referral if indicated Outcome: Progressing     

## 2014-10-19 NOTE — Progress Notes (Signed)
CSW order noted for financial assistance- unable to afford his medications.  This is a RNCM function and will discuss with Dorcas Carroweborah Dowell, RNCM in the morning for appropriate follow up.  CSW will sign off but will be available if needed.  Lorri Frederickonna T. Jaci LazierCrowder, KentuckyLCSW [phone]

## 2014-10-19 NOTE — Plan of Care (Signed)
Problem: Consults Goal: MI Patient Education (See Patient Education module for education specifics.) Outcome: Progressing Goal: Skin Care Protocol Initiated - if Braden Score 18 or less If consults are not indicated, leave blank or document N/A Outcome: Not Applicable Date Met:  82/06/01 Goal: Tobacco Cessation referral if indicated Outcome: Progressing Goal: Nutrition Consult-if indicated Outcome: Not Applicable Date Met:  56/15/37 Goal: Diabetes Guidelines if Diabetic/Glucose > 140 If diabetic or lab glucose is > 140 mg/dl - Initiate Diabetes/Hyperglycemia Guidelines & Document Interventions  Outcome: Not Applicable Date Met:  94/32/76  Problem: Phase I Progression Outcomes Goal: Anginal pain relieved Outcome: Completed/Met Date Met:  10/19/14 Goal: Aspirin unless contraindicated Outcome: Completed/Met Date Met:  10/19/14 Goal: Voiding-avoid urinary catheter unless indicated Outcome: Completed/Met Date Met:  10/19/14 Goal: Hemodynamically stable Outcome: Completed/Met Date Met:  10/19/14 Goal: Vascular site scale level 0 - I Vascular Site Scale Level 0: No bruising/bleeding/hematoma Level I (Mild): Bruising/Ecchymosis, minimal bleeding/ooozing, palpable hematoma < 3 cm Level II (Moderate): Bleeding not affecting hemodynamic parameters, pseudoaneurysm, palpable hematoma > 3 cm Level III (Severe) Bleeding which affects hemodynamic parameters or retroperitoneal hemorrhage  Outcome: Completed/Met Date Met:  10/19/14 Goal: Initial discharge plan identified Outcome: Completed/Met Date Met:  10/19/14

## 2014-10-19 NOTE — Progress Notes (Signed)
Subjective:  Patient denies any chest pain or shortness of breath. States feels better after PCI to proximal LAD. Cardiac enzymes minimally elevated and are trending down  Objective:  Vital Signs in the last 24 hours: Temp:  [97.5 F (36.4 C)-97.9 F (36.6 C)] 97.9 F (36.6 C) (11/23 0300) Pulse Rate:  [37-83] 70 (11/23 1034) Resp:  [11-26] 19 (11/23 0800) BP: (116-161)/(73-103) 133/86 mmHg (11/23 1034) SpO2:  [97 %-100 %] 99 % (11/23 0800) Arterial Line BP: (140-158)/(82-97) 153/95 mmHg (11/23 0030) Weight:  [111.1 kg (244 lb 14.9 oz)-113.399 kg (250 lb)] 111.1 kg (244 lb 14.9 oz) (11/22 2325)  Intake/Output from previous day: 11/22 0701 - 11/23 0700 In: 1478.5 [I.V.:1478.5] Out: 1900 [Urine:1900] Intake/Output from this shift: Total I/O In: 173.4 [I.V.:173.4] Out: -   Physical Exam: Neck: no adenopathy, no carotid bruit, no JVD and supple, symmetrical, trachea midline Lungs: clear to auscultation bilaterally Heart: regular rate and rhythm, S1, S2 normal and Soft systolic murmur noted no S3 gallop Abdomen: soft, non-tender; bowel sounds normal; no masses,  no organomegaly Extremities: extremities normal, atraumatic, no cyanosis or edema  Lab Results:  Recent Labs  10/18/14 2008 10/19/14 0307  WBC 14.1* 11.7*  HGB 12.4* 11.0*  PLT 347 319    Recent Labs  10/18/14 2008 10/19/14 0307  NA 142 140  K 4.0 3.8  CL 104 105  CO2 24 19  GLUCOSE 146* 106*  BUN 9 8  CREATININE 1.02 0.83    Recent Labs  10/18/14 0015 10/19/14 0307  TROPONINI 1.62* 1.47*   Hepatic Function Panel  Recent Labs  10/18/14 0015  PROT 6.3  ALBUMIN 3.0*  AST 16  ALT 22  ALKPHOS 79  BILITOT <0.2*    Recent Labs  10/19/14 0307  CHOL 167   No results for input(s): PROTIME in the last 72 hours.  Imaging: Imaging results have been reviewed and Dg Chest Port 1 View  10/18/2014   CLINICAL DATA:  Centralized chest pain beginning today, associated with shortness of breath.  Intermittent symptoms for 1 month.  EXAM: PORTABLE CHEST - 1 VIEW  COMPARISON:  None.  FINDINGS: Cardiomediastinal silhouette is unremarkable. Mild prominence of central bronchovascular markings without pleural effusions or focal consolidations. No pneumothorax. Soft tissue planes and included osseous structures are unremarkable.  IMPRESSION: Mild prominence of central bronchovascular markings can be seen with reactive airway disease or bronchitis without focal consolidation.   Electronically Signed   By: Awilda Metroourtnay  Bloomer   On: 10/18/2014 20:33    Cardiac Studies:  Assessment/Plan:  Status post very small anteroseptal wall myocardial infarction status post PCI to proximal LAD with excellent results New-onset hypertension Hypercholesteremia Tobacco abuse Marijuana abuse Positive family history of coronary artery disease Plan Start Ace inhibitors as per orders Phase I cardiac rehabilitation Check labs in a.m. Possible discharge tomorrow if stable  LOS: 1 day    Bowie Delia N 10/19/2014, 10:56 AM

## 2014-10-19 NOTE — Progress Notes (Signed)
After patient was educated about arterial sheath, right groin arterial sheath was pulled out and pressure held for 20 minutes to achieve hemostasis withoout complication.Procedure was tolerated well by patient.Distal pulses are palpable. Will continue to monitor

## 2014-10-19 NOTE — Care Management Note (Addendum)
    Page 1 of 1   10/20/2014     9:10:27 AM CARE MANAGEMENT NOTE 10/20/2014  Patient:  Jack Salas,Jack Salas   Account Number:  [id number]  Date Initiated:  10/19/2014  Documentation initiated by:  Junius CreamerWELL,DEBBIE  Subjective/Objective Assessment:   adm w mi     Action/Plan:   lives w fam, has bcbs ins-ins has lapsed from prev job, no ins at present   Anticipated DC Date:  10/20/2014   Anticipated DC Plan:  HOME/SELF CARE      DC Planning Services  CM consult  Medication Assistance  [facility]igent [facility]      Choice offered to / List presented to:             Status of service:   Medicare Important Message given?   (If response is "NO", the following Medicare IM given date fields will be blank) Date Medicare IM given:   Medicare IM given by:   Date Additional Medicare IM given:   Additional Medicare IM given by:    Discharge Disposition:  HOME/SELF CARE  Per UR Regulation:  Reviewed for med. necessity/level of care/duration of stay  If discussed at Long Length of Stay Meetings, dates discussed:    Comments:  11/24  0908 debbie Darice Vicario rn,bsn spoke w pt and wife. gave them 30day free effient card. pt states ins has lapsed w bcbs. no ins at present. gave them inform on [facility] and they plan to go there for appt and meds after disch. gave pt pt assist form for effient also. explained again importance of taking effient.  11/23 1011 debbie Isaiah Torok rn,bsn pt states has bcbs ins. gave pt effient copay assist card that brings copay down to zero copay per month.

## 2014-10-19 NOTE — Progress Notes (Signed)
Echocardiogram 2D Echocardiogram has been performed.  Jack Salas 10/19/2014, 11:10 AM

## 2014-10-19 NOTE — Progress Notes (Signed)
ANTICOAGULATION CONSULT NOTE  Pharmacy Consult for aggrastat Indication: chest pain/ACS, s/p LAD stent  No Known Allergies  Patient Measurements: Height: 5\' 11"  (180.3 cm) Weight: 244 lb 14.9 oz (111.1 kg) IBW/kg (Calculated) : 75.3 Heparin Dosing Weight: 99 kg   Vital Signs: Temp: 97.9 F (36.6 C) (11/23 0300) Temp Source: Oral (11/23 0300) BP: 127/79 mmHg (11/23 0500) Pulse Rate: 68 (11/23 0500)  Labs:  Recent Labs  10/18/14 0015 10/18/14 2008 10/18/14 2009 10/19/14 0307  HGB 11.1* 12.4*  --  11.0*  HCT 34.3* 38.0*  --  34.2*  PLT 332 347  --  319  APTT  --   --  26  --   LABPROT  --   --  13.0  --   INR  --   --  0.97  --   CREATININE 0.84 1.02  --  0.83  TROPONINI 1.62*  --   --  1.47*    Estimated Creatinine Clearance: 143.9 mL/min (by C-G formula based on Cr of 0.83).   Medical History: History reviewed. No pertinent past medical history.  Medications:  Prescriptions prior to admission  Medication Sig Dispense Refill Last Dose  . erythromycin ophthalmic ointment Place a 1/2 inch ribbon of ointment into the lower eyelid four times daily for 2 weeks, then once qHS for 4-8 weeks. 3.5 g 0   . tetrahydrozoline (VISINE) 0.05 % ophthalmic solution Place 2 drops into the left eye once.   Past Week at Unknown time    Assessment: Pharmacy consulted to start heparin on [age] YOM who presented with chest pain yesterday. Now s/p stemi/cath/stent yesterday, started on aggrastat during cath, to stop tonight at 1700. Hgb down slightly, plt stable, no hematoma or bleeding issues noted.   Goal of Therapy:  Monitor platelets by anticoagulation protocol: Yes   Plan:  -Continue aggrastat 0.3915mcg/kg/min - stop date in place -Monitor daily HL, CBC and s/s of bleeding  Sheppard CoilFrank Dmiya Malphrus PharmD., BCPS Clinical Pharmacist Pager [phone] 10/19/2014 7:51 AM

## 2014-10-20 LAB — BASIC METABOLIC PANEL
ANION GAP: 16 — AB (ref 5–15)
BUN: 9 mg/dL (ref 6–23)
CHLORIDE: 101 meq/L (ref 96–112)
CO2: 20 meq/L (ref 19–32)
Calcium: 8.7 mg/dL (ref 8.4–10.5)
Creatinine, Ser: 0.9 mg/dL (ref 0.50–1.35)
GFR calc Af Amer: >90 mL/min (ref >90)
GFR calc non Af Amer: >90 mL/min (ref >90)
Glucose, Bld: 93 mg/dL (ref 70–99)
Potassium: 3.8 mEq/L (ref 3.7–5.3)
SODIUM: 137 meq/L (ref 137–147)

## 2014-10-20 LAB — TROPONIN I: TROPONIN I: 1.29 ng/mL — AB (ref <0.30)

## 2014-10-20 LAB — CBC
HCT: 37 % — ABNORMAL LOW (ref 39.0–52.0)
Hemoglobin: 12 g/dL — ABNORMAL LOW (ref 13.0–17.0)
MCH: 24.2 pg — ABNORMAL LOW (ref 26.0–34.0)
MCHC: 32.4 g/dL (ref 30.0–36.0)
MCV: 74.6 fL — AB (ref 78.0–100.0)
PLATELETS: 384 10*3/uL (ref 150–400)
RBC: 4.96 MIL/uL (ref 4.22–5.81)
RDW: 14.5 % (ref 11.5–15.5)
WBC: 9.1 10*3/uL (ref 4.0–10.5)

## 2014-10-20 MED ORDER — ASPIRIN 81 MG PO CHEW: 81.0000 mg | CHEWABLE_TABLET | Freq: Every day | ORAL | Status: DC

## 2014-10-20 MED ORDER — METOPROLOL TARTRATE 12.5 MG HALF TABLET: 12.5000 mg | ORAL_TABLET | Freq: Two times a day (BID) | ORAL | Status: DC

## 2014-10-20 MED ORDER — PRASUGREL HCL 10 MG PO TABS: 10.0000 mg | ORAL_TABLET | Freq: Every day | ORAL | Status: DC

## 2014-10-20 MED ORDER — LISINOPRIL 10 MG PO TABS: 10.0000 mg | ORAL_TABLET | Freq: Every day | ORAL | Status: DC

## 2014-10-20 MED ORDER — ATORVASTATIN CALCIUM 80 MG PO TABS: 80.0000 mg | ORAL_TABLET | Freq: Every day | ORAL | Status: DC

## 2014-10-20 MED ORDER — NITROGLYCERIN 0.4 MG SL SUBL: 0.4000 mg | SUBLINGUAL_TABLET | SUBLINGUAL | Status: DC | PRN

## 2014-10-20 NOTE — Discharge Instructions (Signed)
° °  ° °Acute Coronary Syndrome °Acute coronary syndrome (ACS) is an urgent problem in which the blood and oxygen supply to the heart is critically deficient. ACS requires hospitalization because one or more coronary arteries may be blocked. °ACS represents a range of conditions including: °· Previous angina that is now unstable, lasts longer, happens at rest, or is more intense. °· A heart attack, with heart muscle cell injury and death. °There are three vital coronary arteries that supply the heart muscle with blood and oxygen so that it can pump blood effectively. If blockages to these arteries develop, blood flow to the heart muscle is reduced. If the heart does not get enough blood, angina may occur as the first warning sign. °SYMPTOMS  °· The most common signs of angina include: °· Tightness or squeezing in the chest. °· Feeling of heaviness on the chest. °· Discomfort in the arms, neck, back, or jaw. °· Shortness of breath and nausea. °· Cold, wet skin. °· Angina is usually brought on by physical effort or excitement which increase the oxygen needs of the heart. These states increase the blood flow needs of the heart beyond what can be delivered. °· Other symptoms that are not as common include: °· Fatigue °· Unexplained feelings of nervousness or anxiety °· Weakness °· Diarrhea °· Sometimes, you may not have noticed any symptoms at all but still suffered a cardiac injury. °TREATMENT  °· Medicines to help discomfort may include nitroglycerin (nitro) in the form of tablets or a spray for rapid relief, or longer-acting forms such as cream, patches, or capsules. (Be aware that there are many side effects and possible interactions with other drugs). °· Other medicines may be used to help the heart pump better. °· Procedures to open blocked arteries including angioplasty or stent placement to keep the arteries open. °· Open heart surgery may be needed when there are many blockages or they are in critical  locations that are best treated with surgery. °HOME CARE INSTRUCTIONS  °· Do not use any tobacco products including cigarettes, chewing tobacco, or electronic cigarettes. °· Take one baby or adult aspirin daily, if your health care provider advises. This helps reduce the risk of a heart attack. °· It is very important that you follow the angina treatment prescribed by your health care provider. Make arrangements for proper follow-up care. °· Eat a heart healthy diet with salt and fat restrictions as advised. °· Regular exercise is good for you as long as it does not cause discomfort. Do not begin any new type of exercise until you check with your health care provider. °· If you are overweight, you should lose weight. °· Try to maintain normal blood lipid levels. °· Keep your blood pressure under control as recommended by your health care provider. °· You should tell your health care provider right away about any increase in the severity or frequency of your chest discomfort or angina attacks. When you have angina, you should stop what you are doing and sit down. This may bring relief in 3 to 5 minutes. If your health care provider has prescribed nitro, take it as directed. °· If your health care provider has given you a follow-up appointment, it is very important to keep that appointment. Not keeping the appointment could result in a chronic or permanent injury, pain, and disability. If there is any problem keeping the appointment, you must call back to this facility for assistance. °SEEK IMMEDIATE MEDICAL CARE IF:  °· You develop   nausea, vomiting, or shortness of breath. °· You feel faint, lightheaded, or pass out. °· Your chest discomfort gets worse. °· You are sweating or experience sudden profound fatigue. °· You do not get relief of your chest pain after 3 doses of nitro. °· Your discomfort lasts longer than 15 minutes. °MAKE SURE YOU:  °· Understand these instructions. °· Will watch your condition. °· Will get  help right away if you are not doing well or get worse. °· Take all medicines as directed by your health care provider. °Document Released: 11/13/2005 Document Revised: 11/18/2013 Document Reviewed: 03/17/2014 °ExitCare® Patient Information ©2015 [employer]. This information is not intended to replace advice given to you by your health care provider. Make sure you discuss any questions you have with your health care provider. °Coronary Angiogram with Stent °Coronary angiography with stent placement is a procedure to widen or open a narrow blood vessel of the heart (coronary artery). When a coronary artery becomes partially blocked, it decreases blood flow to that area. This may lead to chest pain or a heart attack (myocardial infarction). Arteries may become blocked by cholesterol buildup (plaque) in the lining or wall.  °A stent is a small piece of metal that looks like a mesh or a spring. Stent placement may be done right after a coronary angiography in which a blocked artery is found or as a treatment for a heart attack.  °LET YOUR HEALTH CARE PROVIDER KNOW ABOUT: °· Any allergies you have.   °· All medicines you are taking, including vitamins, herbs, eye drops, creams, and over-the-counter medicines.   °· Previous problems you or members of your family have had with the use of anesthetics.   °· Any blood disorders you have.   °· Previous surgeries you have had.   °· Medical conditions you have. °RISKS AND COMPLICATIONS °Generally, coronary angiography with stent is a safe procedure. However, problems can occur and include: °· Damage to the heart or its blood vessels.   °· A return of blockage.   °· Bleeding, infection, or bruising at the insertion site.   °· A collection of blood under the skin (hematoma) at the insertion site. °· Blood clot in another part of the body.   °· Kidney injury.   °· Allergic reaction to the dye or contrast used.   °· Bleeding into the abdomen (retroperitoneal bleeding). °BEFORE  THE PROCEDURE °· Do not eat or drink anything after midnight on the night before the procedure or as directed by your health care provider.  °· Ask your health care provider about changing or stopping your regular medicines. This is especially important if you are taking diabetes medicines or blood thinners. °· Your health care provider will make sure you understand the procedure as well as the risks and potential problems associated with the procedure.   °PROCEDURE °· You may be given a medicine to help you relax before and during the procedure (sedative). This medicine will be given through an IV tube that is put into one of your veins.   °· The area where the catheter will be inserted will be shaved and cleaned. This is usually done in the groin but may be done in the fold of your arm (near your elbow) or in the wrist.    °· A medicine will be given to numb the area where the catheter will be inserted (local anesthetic).   °· The catheter will be inserted into an artery using a guide wire. A type of X-ray (fluoroscopy) will be used to help guide the catheter to the opening of the blocked   artery.   °· A dye will then be injected into the catheter, and X-rays will be taken. The dye will help to show where any narrowing or blockages are located in the heart arteries.   °· A tiny wire will be guided to the blocked spot, and a balloon will be inflated to make the artery wider. The stent will be expanded and will crush the plaque into the wall of the vessel. The stent will hold the area open like a scaffolding and improve the blood flow.   °· Sometimes the artery may be made wider using a laser or other tools to remove plaque.   °· When the blood flow is better, the catheter will be removed. The lining of the artery will grow over the stent, which stays where it was placed.   °AFTER THE PROCEDURE °· If the procedure is done through the leg, you will be kept in bed lying flat for about 6 hours. You will be instructed to  not bend or cross your legs.   °· The insertion site will be checked frequently.   °· The pulse in your feet or wrist will be checked frequently.   °· Additional blood tests, X-rays, and electrocardiography may be done. °Document Released: 05/20/2003 Document Revised: 03/30/2014 Document Reviewed: 05/22/2013 °ExitCare® Patient Information ©2015 [employer]. This information is not intended to replace advice given to you by your health care provider. Make sure you discuss any questions you have with your health care provider. ° °

## 2014-10-20 NOTE — Progress Notes (Signed)
10/20/2014 9:54 AM   IV's removed. Discharge instructions given and taken to car via w/c Davelle Anselmi, Linnell Fullingose Burnett

## 2014-10-20 NOTE — Discharge Summary (Signed)
Discharge summary dictated on 10/20/2014 dictation number is [phone]

## 2014-10-20 NOTE — Discharge Summary (Signed)
[name]:  [place], [place]                ACCOUNT NO.:  [id number]  MEDICAL RECORD NO.:  [id number]  LOCATION:  2H10C                        FACILITY:  [facility]  PHYSICIAN:  Chaze Hruska N. Sharyn Salas, M.D. DATE OF BIRTH:  08/31/1971  DATE OF ADMISSION:  10/18/2014 DATE OF DISCHARGE:  10/20/2014                              DISCHARGE SUMMARY   ADMITTING DIAGNOSES: 1. Acute coronary syndrome, rule out myocardial infarction. 2. New-onset hypertension. 3. Tobacco abuse. 4. Marijuana abuse. 5. Positive family history of coronary artery disease.  DISCHARGE DIAGNOSES: 1. Status post very small anteroseptal wall myocardial infarction,     status post percutaneous coronary intervention to proximal left     anterior descending with excellent results. 2. Hypertension. 3. Hypercholesteremia. 4. Tobacco abuse. 5. Marijuana abuse. 6. Positive family history of coronary artery disease.  DISCHARGE HOME MEDICATIONS: 1. Aspirin 81 mg 1 tablet daily. 2. Prasugrel 10 mg 1 tablet daily. 3. Metoprolol tartrate 12.5 mg twice daily. 4. Lisinopril 10 mg daily. 5. Atorvastatin 80 mg daily. 6. Nitrostat 0.4 mg sublingual use as directed.  DIET:  Low salt, low cholesterol.  Postcardiac catheterization and stenting instructions have been given. The patient has been advised regarding lifestyle changes and smoking cessation and drug abuse cessation to which he agrees.  The patient will be scheduled for phase 2 cardiac rehab as outpatient.  CONDITION AT DISCHARGE:  Stable.  BRIEF HISTORY AND HOSPITAL COURSE:  Mr. Jack Salas is a 43 year old male with no significant past medical history except for tobacco abuse, marijuana abuse, and strong family history of coronary artery disease. Father died of massive MI at the age of 44.  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  EKG done in the ER showed normal sinus rhythm with septal Q-waves with ST elevation, concave upward in leads II, III aVF, V5, V6 without significant reciprocal changes.  STEMI was called, and the patient was [facility].  The patient does give history of exertional chest pain while at work, while lifting boxes, relieved with rest for last few days.  The patient did not seek any medical attention in the past.  The patient denies any cocaine abuse.  The patient received IV heparin, aspirin, IV morphine with partial relief of chest pain and was transferred emergently to [facility].  PHYSICAL EXAMINATION:  GENERAL:  He was anxious, alert awake, oriented, blood pressure was 161/98, pulse was 79. HEENT:  Conjunctivae were pink. NECK:  Supple.  No JVD.  No bruit. LUNGS:  Clear to auscultation without rhonchi or rales. CARDIOVASCULAR:  S1, S2 was normal.  There was soft systolic murmur and S4 gallop. ABDOMEN:  Soft.  Bowel sounds were present.  Nontender. EXTREMITIES:  There was no clubbing, cyanosis, or edema.  LABORATORY DATA:  Sodium was 138, potassium 4.0, BUN 8, creatinine 0.84, troponin I was 1.62.  Hemoglobin was 11.1, hematocrit 34.3.  Repeat troponin was 1.47, 2.67.  Today, troponin I is 1.29, which is trending down.  Sodium  today is 137, potassium 3.8, BUN 9, creatinine 0.90, glucose is 93.  Hemoglobin 12, hematocrit 37.0, white count of 9.1, which has been stable.  His cholesterol was 167, LDL 92, HDL 48, triglycerides 134.  BRIEF HOSPITAL COURSE:  The patient was emergently brought to the cath lab [facility] and underwent left cardiac cath with selective left and right coronary angiography and PTCA and stenting to proximal LAD as per procedure report.  The patient tolerated the procedure well. There were no complications.  Postprocedure, the patient did not have any  episodes of chest pain during the hospital stay.  His groin is stable with no evidence of hematoma or bruit.  Phase 1 cardiac rehab was called.  The patient has been ambulating in hallway without any problems.  The patient's cardiac enzymes are trending down.  The patient will be discharged home.  There were no significant arrhythmias during the hospital stay.  The patient will be discharged home on above medications and will be followed up in my office in 1 week.  The patient has been extensively counseled regarding lifestyle changes, diet, and smoking and drug cessation.  The patient will be scheduled for phase 2 cardiac rehab as outpatient.     Jack Salas, M.D.     MNH/MEDQ  D:  10/20/2014  T:  10/20/2014  Job:  [id number]

## 2014-10-20 NOTE — Progress Notes (Signed)
Second order received this morning to assist patient with medications prior to d/c.  This is an RNCM function and I have notified RNCM- Junius CreamerDebbie Dowell about patient's need and requested follow up. CSW will sign off but available should need for SW intervention be identified.  Lorri Frederickonna T. Jaci LazierCrowder, [place]  [phone]

## 2014-10-20 NOTE — Progress Notes (Signed)
Ed completed with pt as he was leaving. Discussed ex gl and NTG. Voiced understanding.  [phone] Ethelda ChickKristan Aliany Fiorenza CES, ACSM 10:09 AM 10/20/2014

## 2014-10-20 NOTE — Progress Notes (Signed)
Pt walked two laps around unit. Pt denied SOB and CP. PT stated "I feel really great." Pt returned to room and currently sitting in chair.

## 2014-10-29 ENCOUNTER — Encounter: Payer: Self-pay | Admitting: Internal Medicine

## 2014-10-29 ENCOUNTER — Ambulatory Visit: Payer: BC Managed Care – PPO | Attending: Internal Medicine | Admitting: Internal Medicine

## 2014-10-29 VITALS — BP 109/74 | HR 76 | Temp 98.0°F | Resp 15 | Wt 246.0 lb

## 2014-10-29 DIAGNOSIS — Z7982 Long term (current) use of aspirin: Secondary | ICD-10-CM | POA: Insufficient documentation

## 2014-10-29 DIAGNOSIS — Z95820 Peripheral vascular angioplasty status with implants and grafts: Secondary | ICD-10-CM | POA: Insufficient documentation

## 2014-10-29 DIAGNOSIS — Z9889 Other specified postprocedural states: Secondary | ICD-10-CM

## 2014-10-29 DIAGNOSIS — Z0000 Encounter for general adult medical examination without abnormal findings: Secondary | ICD-10-CM

## 2014-10-29 DIAGNOSIS — Z87891 Personal history of nicotine dependence: Secondary | ICD-10-CM | POA: Insufficient documentation

## 2014-10-29 DIAGNOSIS — I10 Essential (primary) hypertension: Secondary | ICD-10-CM | POA: Insufficient documentation

## 2014-10-29 DIAGNOSIS — F1210 Cannabis abuse, uncomplicated: Secondary | ICD-10-CM | POA: Insufficient documentation

## 2014-10-29 DIAGNOSIS — I252 Old myocardial infarction: Secondary | ICD-10-CM | POA: Insufficient documentation

## 2014-10-29 LAB — BASIC METABOLIC PANEL
BUN: 16 mg/dL (ref 6–23)
CALCIUM: 9.5 mg/dL (ref 8.4–10.5)
CO2: 25 mEq/L (ref 19–32)
Chloride: 106 mEq/L (ref 96–112)
Creat: 0.93 mg/dL (ref 0.50–1.35)
Glucose, Bld: 161 mg/dL — ABNORMAL HIGH (ref 70–99)
Potassium: 4 mEq/L (ref 3.5–5.3)
SODIUM: 139 meq/L (ref 135–145)

## 2014-10-29 LAB — POCT GLYCOSYLATED HEMOGLOBIN (HGB A1C): Hemoglobin A1C: 6.2

## 2014-10-29 MED ORDER — LISINOPRIL 10 MG PO TABS: 10.0000 mg | ORAL_TABLET | Freq: Every day | ORAL | Status: DC

## 2014-10-29 MED ORDER — METOPROLOL TARTRATE 25 MG PO TABS: 12.5000 mg | ORAL_TABLET | Freq: Two times a day (BID) | ORAL | Status: DC

## 2014-10-29 MED ORDER — ATORVASTATIN CALCIUM 80 MG PO TABS: 80.0000 mg | ORAL_TABLET | Freq: Every day | ORAL | Status: DC

## 2014-10-29 MED ORDER — METOPROLOL TARTRATE 12.5 MG HALF TABLET: 12.5000 mg | ORAL_TABLET | Freq: Two times a day (BID) | ORAL | Status: DC

## 2014-10-29 MED ORDER — PRASUGREL HCL 10 MG PO TABS: 10.0000 mg | ORAL_TABLET | Freq: Every day | ORAL | Status: DC

## 2014-10-29 NOTE — Progress Notes (Signed)
Patient here for follow up from hospital Was admitted for heart attack Had a stent put in  Patient has since  stopped smoking

## 2014-10-29 NOTE — Progress Notes (Signed)
Patient ID: Jack Salas, male   DOB: 12/30/1970, 43 y.o.   MRN: [id number]   Jack Salas, is a 43 y.o. male  [id number]  [id number]  DOB - 07/31/1971  CC:  Chief Complaint  Patient presents with  . Follow-up       HPI: Jack Salas is a 43 y.o. male here today to establish medical care. Patient has no significant past medical history except for tobacco use, marijuana abuse but has strong family history of coronary artery disease with his father dying at age 44 of massive heart attack. He was recently seen in the ED and subsequently admitted to the hospital for ST elevation MI, he is now status post stenting of proximal left anterior descending artery with excellent result. He has new onset hypertension. Patient has been started on appropriate medications which include atorvastatin 80 mg tablet by mouth daily, lisinopril 10 mg tablet by mouth daily, metoprolol 12.5 mg tablet by mouth twice a day, and Effient 10 mg tablet by mouth daily. Patient claims compliant with medications, he has quit smoking cigarette. He drinks alcohol occasionally. He is here today for hospital discharge follow-up with no complaint except that he needs a refill on the medications. He claims to now have an exercise regimen. Patient has No headache, No chest pain, No abdominal pain - No Nausea, No new weakness tingling or numbness, No Cough - SOB.  No Known Allergies Past Medical History  Diagnosis Date  . Myocardial infarction    Current Outpatient Prescriptions on File Prior to Visit  Medication Sig Dispense Refill  . aspirin 81 MG chewable tablet Chew 1 tablet (81 mg total) by mouth daily. 30 tablet 3  . nitroGLYCERIN (NITROSTAT) 0.4 MG SL tablet Place 1 tablet (0.4 mg total) under the tongue every 5 (five) minutes x 3 doses as needed for chest pain. 25 tablet 12   No current facility-administered medications on file prior to visit.   History reviewed. No pertinent family history. History   Social  History  . Marital Status: Single    Spouse Name: N/A    Number of Children: N/A  . Years of Education: N/A   Occupational History  . Not on file.   Social History Main Topics  . Smoking status: Former Smoker -- 0.50 packs/day  . Smokeless tobacco: Not on file  . Alcohol Use: 0.0 oz/week    0 Not specified per week     Comment: occassional  . Drug Use: Yes    Special: Marijuana  . Sexual Activity: Yes   Other Topics Concern  . Not on file   Social History Narrative    Review of Systems: Constitutional: Negative for fever, chills, diaphoresis, activity change, appetite change and fatigue. HENT: Negative for ear pain, nosebleeds, congestion, facial swelling, rhinorrhea, neck pain, neck stiffness and ear discharge.  Eyes: Negative for pain, discharge, redness, itching and visual disturbance. Respiratory: Negative for cough, choking, chest tightness, shortness of breath, wheezing and stridor.  Cardiovascular: Negative for chest pain, palpitations and leg swelling. Gastrointestinal: Negative for abdominal distention. Genitourinary: Negative for dysuria, urgency, frequency, hematuria, flank pain, decreased urine volume, difficulty urinating and dyspareunia.  Musculoskeletal: Negative for back pain, joint swelling, arthralgia and gait problem. Neurological: Negative for dizziness, tremors, seizures, syncope, facial asymmetry, speech difficulty, weakness, light-headedness, numbness and headaches.  Hematological: Negative for adenopathy. Does not bruise/bleed easily. Psychiatric/Behavioral: Negative for hallucinations, behavioral problems, confusion, dysphoric mood, decreased concentration and agitation.    Objective:   Filed Vitals:  10/29/14 1158  BP: 109/74  Pulse: 76  Temp: 98 F (36.7 C)  Resp: 15    Physical Exam: Constitutional: Patient appears well-developed and well-nourished. No distress. HENT: Normocephalic, atraumatic, External right and left ear normal.  Oropharynx is clear and moist.  Eyes: Conjunctivae and EOM are normal. PERRLA, no scleral icterus. Neck: Normal ROM. Neck supple. No JVD. No tracheal deviation. No thyromegaly. CVS: RRR, S1/S2 +, no murmurs, no gallops, no carotid bruit.  Pulmonary: Effort and breath sounds normal, no stridor, rhonchi, wheezes, rales.  Abdominal: Soft. BS +, no distension, tenderness, rebound or guarding.  Musculoskeletal: Normal range of motion. No edema and no tenderness.  Lymphadenopathy: No lymphadenopathy noted, cervical, inguinal or axillary Neuro: Alert. Normal reflexes, muscle tone coordination. No cranial nerve deficit. Skin: Skin is warm and dry. No rash noted. Not diaphoretic. No erythema. No pallor. Psychiatric: Normal mood and affect. Behavior, judgment, thought content normal.  Lab Results  Component Value Date   WBC 9.1 10/20/2014   HGB 12.0* 10/20/2014   HCT 37.0* 10/20/2014   MCV 74.6* 10/20/2014   PLT 384 10/20/2014   Lab Results  Component Value Date   CREATININE 0.90 10/20/2014   BUN 9 10/20/2014   NA 137 10/20/2014   K 3.8 10/20/2014   CL 101 10/20/2014   CO2 20 10/20/2014    No results found for: HGBA1C Lipid Panel     Component Value Date/Time   CHOL 167 10/19/2014 0307   TRIG 134 10/19/2014 0307   HDL 48 10/19/2014 0307   CHOLHDL 3.5 10/19/2014 0307   VLDL 27 10/19/2014 0307   LDLCALC 92 10/19/2014 0307       Assessment and plan:   1. S/P angioplasty with stent  - POCT glycosylated hemoglobin (Hb A1C) - Basic Metabolic Panel -Continue all medications as prescribed - Patient was extensively counseled on nutrition and exercise - Smoking cessation is reinforced, patient in a properly counseled - Patient encouraged to keep appointment with cardiologist coming up  Return in about 3 months (around 01/28/2015), or if symptoms worsen or fail to improve, for CAD, .  The patient was given clear instructions to go to ER or return to medical center if symptoms don't  improve, worsen or new problems develop. The patient verbalized understanding. The patient was told to call to get lab results if they haven't heard anything in the next week.     This note has been created with Education officer, environmentalDragon speech recognition software and smart phrase technology. Any transcriptional errors are unintentional.    Jeanann LewandowskyJEGEDE, Jack Chasse, MD, MHA, FACP, FAAP [facility] [place], [place] [phone]   10/29/2014, 12:29 PM

## 2014-10-29 NOTE — Patient Instructions (Signed)
Coronary Angiogram with Stent °Coronary angiography with stent placement is a procedure to widen or open a narrow blood vessel of the heart (coronary artery). When a coronary artery becomes partially blocked, it decreases blood flow to that area. This may lead to chest pain or a heart attack (myocardial infarction). Arteries may become blocked by cholesterol buildup (plaque) in the lining or wall.  °A stent is a small piece of metal that looks like a mesh or a spring. Stent placement may be done right after a coronary angiography in which a blocked artery is found or as a treatment for a heart attack.  °LET YOUR HEALTH CARE PROVIDER KNOW ABOUT: °· Any allergies you have.   °· All medicines you are taking, including vitamins, herbs, eye drops, creams, and over-the-counter medicines.   °· Previous problems you or members of your family have had with the use of anesthetics.   °· Any blood disorders you have.   °· Previous surgeries you have had.   °· Medical conditions you have. °RISKS AND COMPLICATIONS °Generally, coronary angiography with stent is a safe procedure. However, problems can occur and include: °· Damage to the heart or its blood vessels.   °· A return of blockage.   °· Bleeding, infection, or bruising at the insertion site.   °· A collection of blood under the skin (hematoma) at the insertion site. °· Blood clot in another part of the body.   °· Kidney injury.   °· Allergic reaction to the dye or contrast used.   °· Bleeding into the abdomen (retroperitoneal bleeding). °BEFORE THE PROCEDURE °· Do not eat or drink anything after midnight on the night before the procedure or as directed by your health care provider.  °· Ask your health care provider about changing or stopping your regular medicines. This is especially important if you are taking diabetes medicines or blood thinners. °· Your health care provider will make sure you understand the procedure as well as the risks and potential problems  associated with the procedure.   °PROCEDURE °· You may be given a medicine to help you relax before and during the procedure (sedative). This medicine will be given through an IV tube that is put into one of your veins.   °· The area where the catheter will be inserted will be shaved and cleaned. This is usually done in the groin but may be done in the fold of your arm (near your elbow) or in the wrist.    °· A medicine will be given to numb the area where the catheter will be inserted (local anesthetic).   °· The catheter will be inserted into an artery using a guide wire. A type of X-ray (fluoroscopy) will be used to help guide the catheter to the opening of the blocked artery.   °· A dye will then be injected into the catheter, and X-rays will be taken. The dye will help to show where any narrowing or blockages are located in the heart arteries.   °· A tiny wire will be guided to the blocked spot, and a balloon will be inflated to make the artery wider. The stent will be expanded and will crush the plaque into the wall of the vessel. The stent will hold the area open like a scaffolding and improve the blood flow.   °· Sometimes the artery may be made wider using a laser or other tools to remove plaque.   °· When the blood flow is better, the catheter will be removed. The lining of the artery will grow over the stent, which stays where it was placed.   °  AFTER THE PROCEDURE °· If the procedure is done through the leg, you will be kept in bed lying flat for about 6 hours. You will be instructed to not bend or cross your legs.   °· The insertion site will be checked frequently.   °· The pulse in your feet or wrist will be checked frequently.   °· Additional blood tests, X-rays, and electrocardiography may be done. °Document Released: 05/20/2003 Document Revised: 03/30/2014 Document Reviewed: 05/22/2013 °ExitCare® Patient Information ©2015 [employer]. This information is not intended to replace advice given to you  by your health care provider. Make sure you discuss any questions you have with your health care provider. ° °

## 2014-11-05 ENCOUNTER — Encounter (HOSPITAL_COMMUNITY): Payer: Self-pay | Admitting: Cardiology

## 2014-11-17 ENCOUNTER — Telehealth: Payer: Self-pay | Admitting: Emergency Medicine

## 2014-11-17 NOTE — Telephone Encounter (Signed)
-----   Message from Quentin Angstlugbemiga E Jegede, MD sent at 11/06/2014  5:26 PM EST ----- Please inform patient that his basic metabolic panel is normal, but his hemoglobin A1c is 6.2%, he is prediabetic and at increased risk of developing diabetes. Encouraged low carbohydrate, low sugar diet and regular physical exercise at least 3 times a week 30 minutes each time.

## 2014-11-17 NOTE — Telephone Encounter (Signed)
Attempted to reach pt with lab results;number listed disconnected

## 2014-12-07 ENCOUNTER — Encounter (HOSPITAL_COMMUNITY): Payer: Self-pay | Admitting: *Deleted

## 2015-01-01 ENCOUNTER — Ambulatory Visit: Payer: Self-pay | Attending: Internal Medicine

## 2015-01-13 ENCOUNTER — Other Ambulatory Visit: Payer: Self-pay | Admitting: Internal Medicine

## 2015-01-13 MED ORDER — PRASUGREL HCL 10 MG PO TABS: 10.0000 mg | ORAL_TABLET | Freq: Every day | ORAL | Status: DC

## 2015-09-10 ENCOUNTER — Other Ambulatory Visit: Payer: Self-pay | Admitting: Cardiology

## 2015-09-10 DIAGNOSIS — R079 Chest pain, unspecified: Secondary | ICD-10-CM

## 2015-09-13 ENCOUNTER — Encounter (HOSPITAL_COMMUNITY)
Admission: RE | Admit: 2015-09-13 | Discharge: 2015-09-13 | Disposition: A | Discharge status: Home / Self Care | Payer: Self-pay | Source: Ambulatory Visit | Attending: Cardiology | Admitting: Cardiology

## 2015-09-13 DIAGNOSIS — R079 Chest pain, unspecified: Secondary | ICD-10-CM | POA: Insufficient documentation

## 2015-09-13 LAB — BASIC METABOLIC PANEL
Anion gap: 8 (ref 5–15)
BUN: 11 mg/dL (ref 6–20)
CHLORIDE: 107 mmol/L (ref 101–111)
CO2: 25 mmol/L (ref 22–32)
CREATININE: 1.03 mg/dL (ref 0.61–1.24)
Calcium: 8.8 mg/dL — ABNORMAL LOW (ref 8.9–10.3)
Glucose, Bld: 131 mg/dL — ABNORMAL HIGH (ref 65–99)
Potassium: 3.5 mmol/L (ref 3.5–5.1)
SODIUM: 140 mmol/L (ref 135–145)

## 2015-09-13 LAB — CBC
HCT: 38.6 % — ABNORMAL LOW (ref 39.0–52.0)
HEMOGLOBIN: 12.5 g/dL — AB (ref 13.0–17.0)
MCH: 24.1 pg — AB (ref 26.0–34.0)
MCHC: 32.4 g/dL (ref 30.0–36.0)
MCV: 74.4 fL — ABNORMAL LOW (ref 78.0–100.0)
PLATELETS: 357 10*3/uL (ref 150–400)
RBC: 5.19 MIL/uL (ref 4.22–5.81)
RDW: 14.5 % (ref 11.5–15.5)
WBC: 10.2 10*3/uL (ref 4.0–10.5)

## 2015-09-13 LAB — LIPID PANEL
CHOLESTEROL: 119 mg/dL (ref 0–200)
HDL: 61 mg/dL (ref >40)
LDL Cholesterol: 49 mg/dL (ref 0–99)
TRIGLYCERIDES: 46 mg/dL (ref <150)
Total CHOL/HDL Ratio: 2 RATIO
VLDL: 9 mg/dL (ref 0–40)

## 2015-09-13 LAB — HEPATIC FUNCTION PANEL
ALT: 59 U/L (ref 17–63)
AST: 52 U/L — ABNORMAL HIGH (ref 15–41)
Albumin: 3.7 g/dL (ref 3.5–5.0)
Alkaline Phosphatase: 86 U/L (ref 38–126)
BILIRUBIN DIRECT: 0.1 mg/dL (ref 0.1–0.5)
BILIRUBIN INDIRECT: 0.7 mg/dL (ref 0.3–0.9)
BILIRUBIN TOTAL: 0.8 mg/dL (ref 0.3–1.2)
Total Protein: 6.8 g/dL (ref 6.5–8.1)

## 2015-09-13 MED ORDER — TECHNETIUM TC 99M SESTAMIBI GENERIC - CARDIOLITE
10.0000 | Freq: Once | INTRAVENOUS | Status: AC | PRN
  Administered 2015-09-13: 10 via INTRAVENOUS

## 2015-09-13 MED ORDER — TECHNETIUM TC 99M SESTAMIBI GENERIC - CARDIOLITE
30.0000 | Freq: Once | INTRAVENOUS | Status: AC | PRN
  Administered 2015-09-13: 30 via INTRAVENOUS

## 2015-11-12 ENCOUNTER — Other Ambulatory Visit: Payer: Self-pay

## 2015-11-12 MED ORDER — ATORVASTATIN CALCIUM 80 MG PO TABS: 80.0000 mg | ORAL_TABLET | Freq: Every day | ORAL | Status: DC

## 2015-11-17 ENCOUNTER — Ambulatory Visit: Payer: Self-pay | Admitting: Family Medicine

## 2015-11-23 ENCOUNTER — Ambulatory Visit: Payer: Self-pay | Admitting: Family Medicine

## 2015-12-03 ENCOUNTER — Ambulatory Visit: Payer: Self-pay | Admitting: Family Medicine

## 2015-12-15 ENCOUNTER — Other Ambulatory Visit: Payer: Self-pay | Admitting: Internal Medicine

## 2015-12-15 MED ORDER — PRASUGREL HCL 10 MG PO TABS: 10.0000 mg | ORAL_TABLET | Freq: Every day | ORAL | Status: DC

## 2015-12-20 ENCOUNTER — Other Ambulatory Visit: Payer: Self-pay | Admitting: Internal Medicine

## 2016-01-21 ENCOUNTER — Ambulatory Visit: Payer: Self-pay | Admitting: Family Medicine

## 2016-02-17 ENCOUNTER — Telehealth: Payer: Self-pay | Admitting: Internal Medicine

## 2016-02-17 ENCOUNTER — Other Ambulatory Visit: Payer: Self-pay | Admitting: Internal Medicine

## 2016-02-17 NOTE — Telephone Encounter (Signed)
Patient is completely out of all his medication.Jack Salas...Jack Salas.he is requesting a refill...patient has missed numerous appointments...Jack Salas.Jack Salas.Jack Salas. Please follow up with patient

## 2016-02-22 NOTE — Telephone Encounter (Signed)
MA will have to get the approval from PCP which is out of the office at this time.

## 2016-03-02 ENCOUNTER — Ambulatory Visit: Payer: Self-pay | Attending: Internal Medicine | Admitting: Internal Medicine

## 2016-03-02 ENCOUNTER — Encounter: Payer: Self-pay | Admitting: Internal Medicine

## 2016-03-02 VITALS — BP 129/84 | HR 83 | Temp 98.3°F | Resp 16 | Ht 71.0 in | Wt 226.0 lb

## 2016-03-02 DIAGNOSIS — Z7982 Long term (current) use of aspirin: Secondary | ICD-10-CM | POA: Insufficient documentation

## 2016-03-02 DIAGNOSIS — Z955 Presence of coronary angioplasty implant and graft: Secondary | ICD-10-CM | POA: Insufficient documentation

## 2016-03-02 DIAGNOSIS — Z959 Presence of cardiac and vascular implant and graft, unspecified: Secondary | ICD-10-CM

## 2016-03-02 DIAGNOSIS — R040 Epistaxis: Secondary | ICD-10-CM

## 2016-03-02 DIAGNOSIS — I10 Essential (primary) hypertension: Secondary | ICD-10-CM

## 2016-03-02 DIAGNOSIS — R7303 Prediabetes: Secondary | ICD-10-CM

## 2016-03-02 DIAGNOSIS — F17210 Nicotine dependence, cigarettes, uncomplicated: Secondary | ICD-10-CM | POA: Insufficient documentation

## 2016-03-02 DIAGNOSIS — I252 Old myocardial infarction: Secondary | ICD-10-CM | POA: Insufficient documentation

## 2016-03-02 DIAGNOSIS — Z95820 Peripheral vascular angioplasty status with implants and grafts: Secondary | ICD-10-CM

## 2016-03-02 DIAGNOSIS — Z79899 Other long term (current) drug therapy: Secondary | ICD-10-CM | POA: Insufficient documentation

## 2016-03-02 DIAGNOSIS — Z760 Encounter for issue of repeat prescription: Secondary | ICD-10-CM | POA: Insufficient documentation

## 2016-03-02 LAB — CBC WITH DIFFERENTIAL/PLATELET
BASOS ABS: 101 {cells}/uL (ref 0–200)
Basophils Relative: 1 %
EOS ABS: 101 {cells}/uL (ref 15–500)
Eosinophils Relative: 1 %
HEMATOCRIT: 39.4 % (ref 38.5–50.0)
HEMOGLOBIN: 12.5 g/dL — AB (ref 13.2–17.1)
LYMPHS ABS: 2727 {cells}/uL (ref 850–3900)
Lymphocytes Relative: 27 %
MCH: 23.7 pg — AB (ref 27.0–33.0)
MCHC: 31.7 g/dL — ABNORMAL LOW (ref 32.0–36.0)
MCV: 74.8 fL — AB (ref 80.0–100.0)
MONO ABS: 909 {cells}/uL (ref 200–950)
MPV: 9.7 fL (ref 7.5–12.5)
Monocytes Relative: 9 %
NEUTROS ABS: 6262 {cells}/uL (ref 1500–7800)
NEUTROS PCT: 62 %
Platelets: 364 10*3/uL (ref 140–400)
RBC: 5.27 MIL/uL (ref 4.20–5.80)
RDW: 15.7 % — ABNORMAL HIGH (ref 11.0–15.0)
WBC: 10.1 10*3/uL (ref 3.8–10.8)

## 2016-03-02 LAB — COMPLETE METABOLIC PANEL WITH GFR
ALBUMIN: 4.1 g/dL (ref 3.6–5.1)
ALK PHOS: 71 U/L (ref 40–115)
ALT: 25 U/L (ref 9–46)
AST: 20 U/L (ref 10–40)
BILIRUBIN TOTAL: 0.3 mg/dL (ref 0.2–1.2)
BUN: 11 mg/dL (ref 7–25)
CALCIUM: 8.8 mg/dL (ref 8.6–10.3)
CO2: 20 mmol/L (ref 20–31)
Chloride: 108 mmol/L (ref 98–110)
Creat: 0.9 mg/dL (ref 0.60–1.35)
GFR, Est African American: >89 mL/min (ref >=60)
GLUCOSE: 97 mg/dL (ref 65–99)
Potassium: 3.9 mmol/L (ref 3.5–5.3)
SODIUM: 139 mmol/L (ref 135–146)
TOTAL PROTEIN: 6.9 g/dL (ref 6.1–8.1)

## 2016-03-02 LAB — LIPID PANEL
CHOLESTEROL: 193 mg/dL (ref 125–200)
HDL: 78 mg/dL (ref >=40)
LDL Cholesterol: 99 mg/dL (ref <130)
Total CHOL/HDL Ratio: 2.5 Ratio (ref <=5.0)
Triglycerides: 79 mg/dL (ref <150)
VLDL: 16 mg/dL (ref <30)

## 2016-03-02 LAB — APTT: aPTT: 31 seconds (ref 24–37)

## 2016-03-02 LAB — PROTIME-INR
INR: 0.92 (ref <1.50)
PROTHROMBIN TIME: 12.5 s (ref 11.6–15.2)

## 2016-03-02 MED ORDER — METOPROLOL TARTRATE 25 MG PO TABS: 12.5000 mg | ORAL_TABLET | Freq: Two times a day (BID) | ORAL | Status: DC

## 2016-03-02 MED ORDER — ASPIRIN 81 MG PO CHEW: 81.0000 mg | CHEWABLE_TABLET | Freq: Every day | ORAL | Status: DC

## 2016-03-02 MED ORDER — ATORVASTATIN CALCIUM 80 MG PO TABS: 80.0000 mg | ORAL_TABLET | Freq: Every day | ORAL | Status: DC

## 2016-03-02 MED ORDER — LISINOPRIL 10 MG PO TABS: 10.0000 mg | ORAL_TABLET | Freq: Every day | ORAL | Status: DC

## 2016-03-02 NOTE — Progress Notes (Signed)
Patient ID: Jack Salas, male   DOB: 13-Sep-1971, 45 y.o.   MRN: [id number]   Jack Salas, is a 45 y.o. male  [id number]  [id number]  DOB - 1971/01/16  Chief Complaint  Patient presents with  . Medication Refill        Subjective:   Jack Salas is a 44 y.o. male here today for a follow up visit and medication refills after more than a year of being lost to follow-up. 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 Currently has no jobs, recently divorced and lost his house since the heart attack. Smoking cigarette about 1/2 a pack per day, denies the use of illicit drugs. Has been having on and off nose bleed for about 6 months, mostly unprovoked, not painful, not preceded by nasal congestion, no associated headaches. He is currently on aspirin and Effient s/p angioplasty for STEMI in 2015  Patient has No headache, No chest pain, No abdominal pain - No Nausea, No new weakness tingling or numbness, No Cough - SOB.  Problem  Essential Hypertension  Prediabetes  Epistaxis   ALLERGIES: No Known Allergies  PAST MEDICAL HISTORY: Past Medical History  Diagnosis Date  . Myocardial infarction [facility])     MEDICATIONS AT HOME: Prior to Admission medications   Medication Sig Start Date End Date Taking? Authorizing Provider  aspirin 81 MG chewable tablet Chew 1 tablet (81 mg total) by mouth daily. 03/02/16  Yes Quentin Angst, MD  atorvastatin (LIPITOR) 80 MG tablet Take 1 tablet (80 mg total) by mouth daily at 6 PM. 03/02/16  Yes Noreene Boreman E Hyman Hopes, MD  lisinopril (PRINIVIL,ZESTRIL) 10 MG tablet Take 1 tablet (10 mg total) by mouth daily. 03/02/16  Yes Quentin Angst, MD  metoprolol tartrate (LOPRESSOR) 25 MG tablet Take 0.5 tablets (12.5 mg total) by mouth 2 (two) times daily. 03/02/16  Yes Quentin Angst, MD  nitroGLYCERIN (NITROSTAT) 0.4 MG SL tablet Place 1 tablet (0.4 mg total) under the tongue every 5 (five) minutes x 3 doses as needed for  chest pain. 10/20/14  Yes Rinaldo Cloud, MD  prasugrel (EFFIENT) 10 MG TABS tablet Take 1 tablet (10 mg total) by mouth daily. 12/15/15  Yes Quentin Angst, MD     Objective:   Filed Vitals:   03/02/16 0943  BP: 129/84  Pulse: 83  Temp: 98.3 F (36.8 C)  TempSrc: Oral  Resp: 16  Height:  (1.803 m)  Weight: 226 lb (102.513 kg)  SpO2: 99%    Exam General appearance : Awake, alert, not in any distress. Speech Clear. Not toxic looking HEENT: Atraumatic and Normocephalic, pupils equally reactive to light and accomodation Neck: supple, no JVD. No cervical lymphadenopathy.  Chest:Good air entry bilaterally, no added sounds  CVS: S1 S2 regular, no murmurs.  Abdomen: Bowel sounds present, Non tender and not distended with no gaurding, rigidity or rebound. Extremities: B/L Lower Ext shows no edema, both legs are warm to touch Neurology: Awake alert, and oriented X 3, CN II-XII intact, Non focal Skin: No Rash  Data Review Lab Results  Component Value Date   HGBA1C 6.2 10/29/2014     Assessment & Plan   1. S/P angioplasty with stent  - aspirin 81 MG chewable tablet; Chew 1 tablet (81 mg total) by mouth daily.  Dispense: 90 tablet; Refill: 3 - atorvastatin (LIPITOR) 80 MG tablet; Take 1 tablet (80 mg total) by mouth daily at 6 PM.  Dispense: 90 tablet;  Refill: 3 - Lipid panel  2. Essential hypertension  - lisinopril (PRINIVIL,ZESTRIL) 10 MG tablet; Take 1 tablet (10 mg total) by mouth daily.  Dispense: 90 tablet; Refill: 3 - metoprolol tartrate (LOPRESSOR) 25 MG tablet; Take 0.5 tablets (12.5 mg total) by mouth 2 (two) times daily.  Dispense: 90 tablet; Refill: 3 - COMPLETE METABOLIC PANEL WITH GFR  We have discussed target BP range and blood pressure goal. I have advised patient to check BP regularly and to call us back or report to clinic if the numbers are consistently higher than 140/90. We discussed the importance of compliance with medical therapy and DASH diet  recommended, consequences of uncontrolled hypertension discussed.   - continue current BP medications  3. Prediabetes  - POCT glycosylated hemoglobin (Hb A1C)  4. Epistaxis  - CBC with Differential/Platelet - Protime-INR - APTT  Tay was counseled on the dangers of tobacco use, and was advised to quit. Reviewed strategies to maximize success, including removing cigarettes and smoking materials from environment, stress management and support of family/friends.  Patient have been counseled extensively about nutrition and exercise  Return in about 3 months (around 06/01/2016) for Follow up HTN.  The patient was given clear instructions to go to ER or return to medical center if symptoms don't improve, worsen or new problems develop. The patient verbalized understanding. The patient was told to call to get lab results if they haven't heard anything in the next week.   This note has been created with Education officer, environmentalDragon speech recognition software and smart phrase technology. Any transcriptional errors are unintentional.    Jeanann LewandowskyJEGEDE, Caius Silbernagel, MD, MHA, Maxwell CaulFACP, FAAP, CPE [facility] [place], [place] [phone]   03/02/2016, 10:41 AM

## 2016-03-02 NOTE — Progress Notes (Signed)
Patient c/o nose bleed off and on x566months.  Patient denies any pain.  Patient's here for medication refills.

## 2016-03-02 NOTE — Patient Instructions (Signed)
Hypertension °Hypertension, commonly called high blood pressure, is when the force of blood pumping through your arteries is too strong. Your arteries are the blood vessels that carry blood from your heart throughout your body. A blood pressure reading consists of a higher number over a lower number, such as 110/72. The higher number (systolic) is the pressure inside your arteries when your heart pumps. The lower number (diastolic) is the pressure inside your arteries when your heart relaxes. Ideally you want your blood pressure below 120/80. °Hypertension forces your heart to work harder to pump blood. Your arteries may become narrow or stiff. Having untreated or uncontrolled hypertension can cause heart attack, stroke, kidney disease, and other problems. °RISK FACTORS °Some risk factors for high blood pressure are controllable. Others are not.  °Risk factors you cannot control include:  °· Race. You may be at higher risk if you are African American. °· Age. Risk increases with age. °· Gender. Men are at higher risk than women before age 45 years. After age 65, women are at higher risk than men. °Risk factors you can control include: °· Not getting enough exercise or physical activity. °· Being overweight. °· Getting too much fat, sugar, calories, or salt in your diet. °· Drinking too much alcohol. °SIGNS AND SYMPTOMS °Hypertension does not usually cause signs or symptoms. Extremely high blood pressure (hypertensive crisis) may cause headache, anxiety, shortness of breath, and nosebleed. °DIAGNOSIS °To check if you have hypertension, your health care provider will measure your blood pressure while you are seated, with your arm held at the level of your heart. It should be measured at least twice using the same arm. Certain conditions can cause a difference in blood pressure between your right and left arms. A blood pressure reading that is higher than normal on one occasion does not mean that you need treatment. If  it is not clear whether you have high blood pressure, you may be asked to return on a different day to have your blood pressure checked again. Or, you may be asked to monitor your blood pressure at home for 1 or more weeks. °TREATMENT °Treating high blood pressure includes making lifestyle changes and possibly taking medicine. Living a healthy lifestyle can help lower high blood pressure. You may need to change some of your habits. °Lifestyle changes may include: °· Following the DASH diet. This diet is high in fruits, vegetables, and whole grains. It is low in salt, red meat, and added sugars. °· Keep your sodium intake below 2,300 mg per day. °· Getting at least 30-45 minutes of aerobic exercise at least 4 times per week. °· Losing weight if necessary. °· Not smoking. °· Limiting alcoholic beverages. °· Learning ways to reduce stress. °Your health care provider may prescribe medicine if lifestyle changes are not enough to get your blood pressure under control, and if one of the following is true: °· You are 18-59 years of age and your systolic blood pressure is above 140. °· You are 60 years of age or older, and your systolic blood pressure is above 150. °· Your diastolic blood pressure is above 90. °· You have diabetes, and your systolic blood pressure is over 140 or your diastolic blood pressure is over 90. °· You have kidney disease and your blood pressure is above 140/90. °· You have heart disease and your blood pressure is above 140/90. °Your personal target blood pressure may vary depending on your medical conditions, your age, and other factors. °HOME CARE INSTRUCTIONS °·   Have your blood pressure rechecked as directed by your health care provider.   °· Take medicines only as directed by your health care provider. Follow the directions carefully. Blood pressure medicines must be taken as prescribed. The medicine does not work as well when you skip doses. Skipping doses also puts you at risk for  problems. °· Do not smoke.   °· Monitor your blood pressure at home as directed by your health care provider.  °SEEK MEDICAL CARE IF:  °· You think you are having a reaction to medicines taken. °· You have recurrent headaches or feel dizzy. °· You have swelling in your ankles. °· You have trouble with your vision. °SEEK IMMEDIATE MEDICAL CARE IF: °· You develop a severe headache or confusion. °· You have unusual weakness, numbness, or feel faint. °· You have severe chest or abdominal pain. °· You vomit repeatedly. °· You have trouble breathing. °MAKE SURE YOU:  °· Understand these instructions. °· Will watch your condition. °· Will get help right away if you are not doing well or get worse. °  °This information is not intended to replace advice given to you by your health care provider. Make sure you discuss any questions you have with your health care provider. °  °Document Released: 11/13/2005 Document Revised: 03/30/2015 Document Reviewed: 09/05/2013 °Elsevier Interactive Patient Education ©2016 Elsevier Inc. ° °Nosebleed °Nosebleeds are common. They are due to a crack in the inside lining of your nose (mucous membrane) or from a small blood vessel that starts to bleed. Nosebleeds can be caused by many conditions, such as injury, infections, dry mucous membranes or dry climate, medicines, nose picking, and home heating and cooling systems. Most nosebleeds come from blood vessels in the front of your nose. °HOME CARE INSTRUCTIONS  °· Try controlling your nosebleed by pinching your nostrils gently and continuously for at least 10 minutes. °· Avoid blowing or sniffing your nose for a number of hours after having a nosebleed. °· Do not put gauze inside your nose yourself. If your nose was packed by your health care provider, try to maintain the pack inside of your nose until your health care provider removes it. °¨ If a gauze pack was used and it starts to fall out, gently replace it or cut off the end of it. °¨ If a  balloon catheter was used to pack your nose, do not cut or remove it unless your health care provider has instructed you to do that. °· Avoid lying down while you are having a nosebleed. Sit up and lean forward. °· Use a nasal spray decongestant to help with a nosebleed as directed by your health care provider. °· Do not use petroleum jelly or mineral oil in your nose. These can drip into your lungs. °· Maintain humidity in your home by using less air conditioning or by using a humidifier. °· Aspirin and blood thinners make bleeding more likely. If you are prescribed these medicines and you suffer from nosebleeds, ask your health care provider if you should stop taking the medicines or adjust the dose. Do not stop medicines unless directed by your health care provider °· Resume your normal activities as you are able, but avoid straining, lifting, or bending at the waist for several days. °· If your nosebleed was caused by dry mucous membranes, use over-the-counter saline nasal spray or gel. This will keep the mucous membranes moist and allow them to heal. If you must use a lubricant, choose the water-soluble variety. Use it only sparingly, and   do not use it within several hours of lying down. °· Keep all follow-up visits as directed by your health care provider. This is important. °SEEK MEDICAL CARE IF: °· You have a fever. °· You get frequent nosebleeds. °· You are getting nosebleeds more often. °SEEK IMMEDIATE MEDICAL CARE IF: °· Your nosebleed lasts longer than 20 minutes. °· Your nosebleed occurs after an injury to your face, and your nose looks crooked or broken. °· You have unusual bleeding from other parts of your body. °· You have unusual bruising on other parts of your body. °· You feel light-headed or you faint. °· You become sweaty. °· You vomit blood. °· Your nosebleed occurs after a head injury. °  °This information is not intended to replace advice given to you by your health care provider. Make sure  you discuss any questions you have with your health care provider. °  °Document Released: 08/23/2005 Document Revised: 12/04/2014 Document Reviewed: 06/29/2014 °Elsevier Interactive Patient Education ©2016 Elsevier Inc. ° °

## 2016-03-09 ENCOUNTER — Telehealth: Payer: Self-pay | Admitting: *Deleted

## 2016-03-09 NOTE — Telephone Encounter (Signed)
Medical Assistant left message on patient's home and cell voicemail. Voicemail states to give a call back to Jeshurun Oaxaca with [facility] at [phone].  

## 2016-03-09 NOTE — Telephone Encounter (Signed)
-----   Message from Quentin Angstlugbemiga E Jegede, MD sent at 03/07/2016  3:29 PM EDT ----- Please inform patient that his lab results are mostly normal. No evidence of bleeding disorders.

## 2017-01-23 ENCOUNTER — Emergency Department (HOSPITAL_COMMUNITY)
Admission: EM | Admit: 2017-01-23 | Discharge: 2017-01-23 | Disposition: A | Discharge status: Home / Self Care | Payer: Self-pay | Attending: Emergency Medicine | Admitting: Emergency Medicine

## 2017-01-23 ENCOUNTER — Encounter (HOSPITAL_COMMUNITY): Payer: Self-pay

## 2017-01-23 DIAGNOSIS — Z7982 Long term (current) use of aspirin: Secondary | ICD-10-CM | POA: Insufficient documentation

## 2017-01-23 DIAGNOSIS — H1031 Unspecified acute conjunctivitis, right eye: Secondary | ICD-10-CM | POA: Insufficient documentation

## 2017-01-23 DIAGNOSIS — I10 Essential (primary) hypertension: Secondary | ICD-10-CM | POA: Insufficient documentation

## 2017-01-23 DIAGNOSIS — Z87891 Personal history of nicotine dependence: Secondary | ICD-10-CM | POA: Insufficient documentation

## 2017-01-23 DIAGNOSIS — I252 Old myocardial infarction: Secondary | ICD-10-CM | POA: Insufficient documentation

## 2017-01-23 MED ORDER — FLUORESCEIN SODIUM 0.6 MG OP STRP
ORAL_STRIP | OPHTHALMIC | Status: DC
Start: 2017-01-23 — End: 2017-01-23
  Filled 2017-01-23: qty 1

## 2017-01-23 MED ORDER — CIPROFLOXACIN HCL 0.3 % OP SOLN: 1.0000 [drp] | OPHTHALMIC | 0 refills | Status: DC

## 2017-01-23 NOTE — ED Triage Notes (Signed)
Rt.eye became itchy yesterday with drainage.   This morning rt. Eye is red, swollen and has drainage.  Also having sinus congestion and mild sore throat

## 2017-01-23 NOTE — ED Provider Notes (Signed)
[facility] Provider Note   CSN: [id number] Arrival date & time: 01/23/17  [id number]     History   Chief Complaint Chief Complaint  Patient presents with  . Conjunctivitis    HPI Jack Salas is a 46 y.o. male.  HPI Patient presents the emergency para complaints of red right eye and draining right eye.  He woke up this morning with a crusted right eye.  No known sick contacts.  No fevers or chills.  No upper respiratory symptoms.  No other complaints.  Symptoms are mild in severity.  No change in his vision.  Patient does not wear contacts.  He does not work with metal.  Denies trauma to his eye   Past Medical History:  Diagnosis Date  . Myocardial infarction     Patient Active Problem List   Diagnosis Date Noted  . Essential hypertension 03/02/2016  . Prediabetes 03/02/2016  . Epistaxis 03/02/2016  . S/P angioplasty with stent 10/29/2014  . Acute anterior wall MI (HCC) 10/18/2014    Past Surgical History:  Procedure Laterality Date  . LEFT HEART CATHETERIZATION WITH CORONARY ANGIOGRAM N/A 10/18/2014   Procedure: LEFT HEART CATHETERIZATION WITH CORONARY ANGIOGRAM;  Surgeon: Robynn PaneMohan N Harwani, MD;  [facility];  Service: Cardiovascular;  Laterality: N/A;  . PERCUTANEOUS CORONARY STENT INTERVENTION (PCI-S)  10/18/2014   Procedure: PERCUTANEOUS CORONARY STENT INTERVENTION (PCI-S);  Surgeon: Robynn PaneMohan N Harwani, MD;  [facility];  Service: Cardiovascular;;  prox LAD       Home Medications    Prior to Admission medications   Medication Sig Start Date End Date Taking? Authorizing Provider  aspirin 81 MG chewable tablet Chew 1 tablet (81 mg total) by mouth daily. 03/02/16   Quentin Angstlugbemiga E Jegede, MD  atorvastatin (LIPITOR) 80 MG tablet Take 1 tablet (80 mg total) by mouth daily at 6 PM. 03/02/16   Quentin Angstlugbemiga E Jegede, MD  ciprofloxacin (CILOXAN) 0.3 % ophthalmic solution Place 1 drop into the right eye every 2 (two) hours. Administer 1 drop, every 2 hours,  while awake, for 2 days. Then 1 drop, every 4 hours, while awake, for the next 5 days. 01/23/17   Jack BilisKevin Joanne Salah, MD  lisinopril (PRINIVIL,ZESTRIL) 10 MG tablet Take 1 tablet (10 mg total) by mouth daily. 03/02/16   Quentin Angstlugbemiga E Jegede, MD  metoprolol tartrate (LOPRESSOR) 25 MG tablet Take 0.5 tablets (12.5 mg total) by mouth 2 (two) times daily. 03/02/16   Quentin Angstlugbemiga E Jegede, MD  nitroGLYCERIN (NITROSTAT) 0.4 MG SL tablet Place 1 tablet (0.4 mg total) under the tongue every 5 (five) minutes x 3 doses as needed for chest pain. 10/20/14   Rinaldo CloudMohan Harwani, MD  prasugrel (EFFIENT) 10 MG TABS tablet Take 1 tablet (10 mg total) by mouth daily. 12/15/15   Quentin Angstlugbemiga E Jegede, MD    Family History Family History  Problem Relation Age of Onset  . Heart attack Father 2245    Social History Social History  Substance Use Topics  . Smoking status: Former Smoker    Packs/day: 0.50  . Smokeless tobacco: Never Used  . Alcohol use 0.0 oz/week     Comment: occassional     Allergies   Patient has no known allergies.   Review of Systems Review of Systems  All other systems reviewed and are negative.    Physical Exam Updated Vital Signs BP 128/90 (BP Location: Left Arm)   Pulse 109   Temp 98.9 F (37.2 C) (Oral)   Resp 16  Ht 5\' 11"  (1.803 m)   Wt 229 lb (103.9 kg)   SpO2 99%   BMI 31.94 kg/m   Physical Exam  Constitutional: He is oriented to person, place, and time. He appears well-developed and well-nourished.  HENT:  Head: Normocephalic.  Eyes:  Conjunctival injection of the right eye.  Ex recommended normal.  Mild amount of discharge.  No periorbital swelling or erythema.  Neck: Normal range of motion.  Pulmonary/Chest: Effort normal.  Abdominal: He exhibits no distension.  Musculoskeletal: Normal range of motion.  Neurological: He is alert and oriented to person, place, and time.  Psychiatric: He has a normal mood and affect.  Nursing note and vitals reviewed.    ED Treatments /  Results  Labs (all labs ordered are listed, but only abnormal results are displayed) Labs Reviewed - No data to display  EKG  EKG Interpretation None       Radiology No results found.  Procedures Procedures (including critical care time)  Medications Ordered in ED Medications  fluorescein 0.6 MG ophthalmic strip (not administered)     Initial Impression / Assessment and Plan / ED Course  I have reviewed the triage vital signs and the nursing notes.  Pertinent labs & imaging results that were available during my care of the patient were reviewed by me and considered in my medical decision making (see chart for details).     Conjunctivitis.  Home with Cipro drops.  Patient understands this is extremely contagious.  Close primary care follow-up.  Understands return to the ER for new or worsening symptoms  Final Clinical Impressions(s) / ED Diagnoses   Final diagnoses:  Acute conjunctivitis of right eye, unspecified acute conjunctivitis type    New Prescriptions New Prescriptions   CIPROFLOXACIN (CILOXAN) 0.3 % OPHTHALMIC SOLUTION    Place 1 drop into the right eye every 2 (two) hours. Administer 1 drop, every 2 hours, while awake, for 2 days. Then 1 drop, every 4 hours, while awake, for the next 5 days.     Jack Bilis, MD 01/23/17 1022

## 2017-09-22 ENCOUNTER — Encounter (HOSPITAL_COMMUNITY): Payer: Self-pay | Admitting: Emergency Medicine

## 2017-09-22 ENCOUNTER — Emergency Department (HOSPITAL_COMMUNITY)
Admission: EM | Admit: 2017-09-22 | Discharge: 2017-09-22 | Disposition: A | Discharge status: Home / Self Care | Payer: Self-pay | Attending: Emergency Medicine | Admitting: Emergency Medicine

## 2017-09-22 DIAGNOSIS — R369 Urethral discharge, unspecified: Secondary | ICD-10-CM | POA: Insufficient documentation

## 2017-09-22 DIAGNOSIS — Z87891 Personal history of nicotine dependence: Secondary | ICD-10-CM | POA: Insufficient documentation

## 2017-09-22 DIAGNOSIS — I252 Old myocardial infarction: Secondary | ICD-10-CM | POA: Insufficient documentation

## 2017-09-22 DIAGNOSIS — Z7982 Long term (current) use of aspirin: Secondary | ICD-10-CM | POA: Insufficient documentation

## 2017-09-22 DIAGNOSIS — Z113 Encounter for screening for infections with a predominantly sexual mode of transmission: Secondary | ICD-10-CM | POA: Insufficient documentation

## 2017-09-22 DIAGNOSIS — Z79899 Other long term (current) drug therapy: Secondary | ICD-10-CM | POA: Insufficient documentation

## 2017-09-22 DIAGNOSIS — Z711 Person with feared health complaint in whom no diagnosis is made: Secondary | ICD-10-CM

## 2017-09-22 DIAGNOSIS — I10 Essential (primary) hypertension: Secondary | ICD-10-CM | POA: Insufficient documentation

## 2017-09-22 LAB — URINALYSIS, ROUTINE W REFLEX MICROSCOPIC
BILIRUBIN URINE: NEGATIVE
GLUCOSE, UA: NEGATIVE mg/dL
KETONES UR: NEGATIVE mg/dL
Nitrite: NEGATIVE
PROTEIN: NEGATIVE mg/dL
Specific Gravity, Urine: 1.012 (ref 1.005–1.030)
pH: 6 (ref 5.0–8.0)

## 2017-09-22 MED ORDER — LIDOCAINE HCL (PF) 1 % IJ SOLN
INTRAMUSCULAR | Status: AC
  Administered 2017-09-22: 5 mL
  Filled 2017-09-22: qty 5

## 2017-09-22 MED ORDER — AZITHROMYCIN 250 MG PO TABS
1000.0000 mg | ORAL_TABLET | Freq: Once | ORAL | Status: AC
  Administered 2017-09-22: 1000 mg via ORAL
  Filled 2017-09-22: qty 4

## 2017-09-22 MED ORDER — CEFTRIAXONE SODIUM 250 MG IJ SOLR
250.0000 mg | Freq: Once | INTRAMUSCULAR | Status: AC
  Administered 2017-09-22: 250 mg via INTRAMUSCULAR
  Filled 2017-09-22: qty 250

## 2017-09-22 NOTE — Discharge Instructions (Signed)
Follow-up with results of lab work and urine culture when available. We will contact you with any positive results and we will treat accordingly. Follow-up at [facility] for further evaluation. Return to ED for worsening symptoms, abdominal pain, pain with urination, increased vomiting.

## 2017-09-22 NOTE — ED Triage Notes (Signed)
Pt presenting with penile discharge x couple days; states hooked up with someone and condom broke; pt denies abd pain, lesions, fever, bloody urine

## 2017-09-22 NOTE — ED Provider Notes (Signed)
[facility] Provider Note   CSN: [id number] Arrival date & time: 09/22/17  [id number]     History   Chief Complaint Chief Complaint  Patient presents with  . Penile Discharge    HPI Jack Salas is a 46 y.o. male present to ED for evaluation of 2 day history of white penile discharge. He states that he had unprotected sexual intercourse last week and and thinks that his condom broke. He reports previous history of similar symptoms when he was in high school but not since then. He denies any known STD exposure. He denies any abdominal pain, fevers, dysuria, hematuria, bowel changes, nausea, vomiting.  HPI  Past Medical History:  Diagnosis Date  . Myocardial infarction [facility](HCC)     Patient Active Problem List   Diagnosis Date Noted  . Essential hypertension 03/02/2016  . Prediabetes 03/02/2016  . Epistaxis 03/02/2016  . S/P angioplasty with stent 10/29/2014  . Acute anterior wall MI (HCC) 10/18/2014    Past Surgical History:  Procedure Laterality Date  . LEFT HEART CATHETERIZATION WITH CORONARY ANGIOGRAM N/A 10/18/2014   Procedure: LEFT HEART CATHETERIZATION WITH CORONARY ANGIOGRAM;  Surgeon: Robynn PaneMohan N Harwani, MD;  [facility];  Service: Cardiovascular;  Laterality: N/A;  . PERCUTANEOUS CORONARY STENT INTERVENTION (PCI-S)  10/18/2014   Procedure: PERCUTANEOUS CORONARY STENT INTERVENTION (PCI-S);  Surgeon: Robynn PaneMohan N Harwani, MD;  [facility];  Service: Cardiovascular;;  prox LAD       Home Medications    Prior to Admission medications   Medication Sig Start Date End Date Taking? Authorizing Provider  aspirin 81 MG chewable tablet Chew 1 tablet (81 mg total) by mouth daily. 03/02/16   Quentin AngstJegede, Olugbemiga E, MD  atorvastatin (LIPITOR) 80 MG tablet Take 1 tablet (80 mg total) by mouth daily at 6 PM. 03/02/16   Jegede, Phylliss Blakeslugbemiga E, MD  ciprofloxacin (CILOXAN) 0.3 % ophthalmic solution Place 1 drop into the right eye every 2  (two) hours. Administer 1 drop, every 2 hours, while awake, for 2 days. Then 1 drop, every 4 hours, while awake, for the next 5 days. 01/23/17   Azalia Bilisampos, Kevin, MD  lisinopril (PRINIVIL,ZESTRIL) 10 MG tablet Take 1 tablet (10 mg total) by mouth daily. 03/02/16   Quentin AngstJegede, Olugbemiga E, MD  metoprolol tartrate (LOPRESSOR) 25 MG tablet Take 0.5 tablets (12.5 mg total) by mouth 2 (two) times daily. 03/02/16   Quentin AngstJegede, Olugbemiga E, MD  nitroGLYCERIN (NITROSTAT) 0.4 MG SL tablet Place 1 tablet (0.4 mg total) under the tongue every 5 (five) minutes x 3 doses as needed for chest pain. 10/20/14   Rinaldo CloudHarwani, Mohan, MD  prasugrel (EFFIENT) 10 MG TABS tablet Take 1 tablet (10 mg total) by mouth daily. 12/15/15   Quentin AngstJegede, Olugbemiga E, MD    Family History Family History  Problem Relation Age of Onset  . Heart attack Father 10245    Social History Social History  Substance Use Topics  . Smoking status: Former Smoker    Packs/day: 0.50  . Smokeless tobacco: Never Used  . Alcohol use 0.0 oz/week     Comment: occassional     Allergies   Patient has no known allergies.   Review of Systems Review of Systems  Constitutional: Negative for chills and fever.  Gastrointestinal: Negative for abdominal pain, constipation, diarrhea, nausea and vomiting.  Genitourinary: Positive for discharge. Negative for dysuria, flank pain, hematuria, penile pain, penile swelling, scrotal swelling, testicular pain and urgency.  Skin: Negative for rash.  Physical Exam Updated Vital Signs BP (!) 134/92   Pulse 88   Temp 98 F (36.7 C) (Oral)   Resp 14   SpO2 99%   Physical Exam  Constitutional: He appears well-developed and well-nourished. No distress.  HENT:  Head: Normocephalic and atraumatic.  Eyes: Conjunctivae and EOM are normal. No scleral icterus.  Neck: Normal range of motion.  Pulmonary/Chest: Effort normal. No respiratory distress.  Genitourinary: Right testis shows no swelling and no tenderness. Left  testis shows no swelling and no tenderness. Circumcised. Discharge found.  Genitourinary Comments: Normal male genitalia noted. Penis, scrotum, and testicles without swelling, lesions, rashes, or tenderness present. White penile discharge noted. Cremasteric reflex intact. RN served as Biomedical engineer during the exam.   Neurological: He is alert.  Skin: No rash noted. He is not diaphoretic.  Psychiatric: He has a normal mood and affect.  Nursing note and vitals reviewed.    ED Treatments / Results  Labs (all labs ordered are listed, but only abnormal results are displayed) Labs Reviewed  URINALYSIS, ROUTINE W REFLEX MICROSCOPIC - Abnormal; Notable for the following:       Result Value   Hgb urine dipstick SMALL (*)    Leukocytes, UA LARGE (*)    Bacteria, UA RARE (*)    Squamous Epithelial / LPF 0-5 (*)    All other components within normal limits  URINE CULTURE  GC/CHLAMYDIA PROBE AMP (Show Low) NOT AT [facility]    EKG  EKG Interpretation None       Radiology No results found.  Procedures Procedures (including critical care time)  Medications Ordered in ED Medications  lidocaine (PF) (XYLOCAINE) 1 % injection (not administered)  cefTRIAXone (ROCEPHIN) injection 250 mg (250 mg Intramuscular Given 09/22/17 0916)  azithromycin (ZITHROMAX) tablet 1,000 mg (1,000 mg Oral Given 09/22/17 0915)     Initial Impression / Assessment and Plan / ED Course  I have reviewed the triage vital signs and the nursing notes.  Pertinent labs & imaging results that were available during my care of the patient were reviewed by me and considered in my medical decision making (see chart for details).     Patient presents to ED for evaluation of 2 day history of penile discharge. He does report sexual intercourse this past weekend and is unsure if his condom broke. On physical exam there is no penile or scrotal tenderness or masses noted but there is white discharge noted. He is afebrile with no  history of fever. Urine showed wbc's and leukocytes but patient denies any dysuria. Will send for culture and treat accordingly, an patient agrees with this plan. Patient declines HIV and RPR testing at this time he states that he had a physical several months ago and was evaluated for these. We'll empirically treat for gonorrhea and chlamydia based on symptoms and advise follow-up with remaining lab work when available. Patient appears stable for discharge at this time. Strict return precautions given.  Final Clinical Impressions(s) / ED Diagnoses   Final diagnoses:  Concern about STD in male without diagnosis  Penile discharge    New Prescriptions New Prescriptions   No medications on file     Dietrich Pates, PA-C 09/22/17 4540    Dione Booze, MD 09/23/17 [phone]

## 2017-09-23 LAB — URINE CULTURE: Culture: NO GROWTH

## 2018-01-17 ENCOUNTER — Ambulatory Visit: Payer: Self-pay

## 2018-01-17 ENCOUNTER — Other Ambulatory Visit: Payer: Self-pay | Admitting: Occupational Medicine

## 2018-01-17 DIAGNOSIS — M79644 Pain in right finger(s): Secondary | ICD-10-CM

## 2018-11-13 ENCOUNTER — Ambulatory Visit: Payer: Self-pay | Admitting: Family Medicine

## 2019-02-14 ENCOUNTER — Other Ambulatory Visit: Payer: Self-pay

## 2019-02-14 ENCOUNTER — Ambulatory Visit (HOSPITAL_COMMUNITY)
Admission: EM | Admit: 2019-02-14 | Discharge: 2019-02-14 | Disposition: A | Discharge status: Home / Self Care | Payer: BLUE CROSS/BLUE SHIELD | Attending: Family Medicine | Admitting: Family Medicine

## 2019-02-14 ENCOUNTER — Encounter (HOSPITAL_COMMUNITY): Payer: Self-pay | Admitting: Family Medicine

## 2019-02-14 DIAGNOSIS — B9789 Other viral agents as the cause of diseases classified elsewhere: Secondary | ICD-10-CM | POA: Diagnosis not present

## 2019-02-14 DIAGNOSIS — J069 Acute upper respiratory infection, unspecified: Secondary | ICD-10-CM | POA: Diagnosis not present

## 2019-02-14 DIAGNOSIS — Z720 Tobacco use: Secondary | ICD-10-CM

## 2019-02-14 DIAGNOSIS — F17200 Nicotine dependence, unspecified, uncomplicated: Secondary | ICD-10-CM

## 2019-02-14 HISTORY — DX: Tobacco use: Z72.0

## 2019-02-14 MED ORDER — BENZONATATE 200 MG PO CAPS: 200.0000 mg | ORAL_CAPSULE | Freq: Two times a day (BID) | ORAL | 0 refills | Status: DC | PRN

## 2019-02-14 NOTE — Discharge Instructions (Signed)
Drink plenty of fluids Go home and rest Take Tessalon every 12 hours for coughing Take Mucinex DM in addition for nasal congestion Expect improvement in a few days

## 2019-02-14 NOTE — ED Provider Notes (Signed)
[facility]    CSN: [id number] Arrival date & time: 02/14/19  1359     History   Chief Complaint Chief Complaint  Patient presents with  . Fatigue    generalized body aches  . Cough    HPI Jack Salas is a 48 y.o. male.   HPI  Patient states that he has had body aches, fever, fatigue, and slight cough since yesterday.  He was sent home from work today.  He is here for evaluation.  Coronavirus is beginning to take hold  Community and he is here wanting reassurance that he does not have COVID 19 I also note that the patient had an MI in 2015.  He states he has stopped all of his medications except for his baby aspirin.  He no longer takes blood pressure medicine, his statin, or his beta-blocker.  He continues to smoke cigarettes.  I did warn him that he is at risk for distant heart disease and possibly repeat heart attack if he does not practice better prevention.  Past Medical History:  Diagnosis Date  . Myocardial infarction (HCC)   . Tobacco abuse 02/14/2019    Patient Active Problem List   Diagnosis Date Noted  . Tobacco abuse 02/14/2019  . Essential hypertension 03/02/2016  . Prediabetes 03/02/2016  . Epistaxis 03/02/2016  . S/P angioplasty with stent 10/29/2014  . Acute anterior wall MI (HCC) 10/18/2014    Past Surgical History:  Procedure Laterality Date  . LEFT HEART CATHETERIZATION WITH CORONARY ANGIOGRAM N/A 10/18/2014   Procedure: LEFT HEART CATHETERIZATION WITH CORONARY ANGIOGRAM;  Surgeon: Robynn Pane, MD;  Location: [facility] CATH LAB;  Service: Cardiovascular;  Laterality: N/A;  . PERCUTANEOUS CORONARY STENT INTERVENTION (PCI-S)  10/18/2014   Procedure: PERCUTANEOUS CORONARY STENT INTERVENTION (PCI-S);  Surgeon: Robynn Pane, MD;  Location: [facility] CATH LAB;  Service: Cardiovascular;;  prox LAD       Home Medications    Prior to Admission medications   Medication Sig Start Date End Date Taking? Authorizing Provider  aspirin 81 MG chewable  tablet Chew 1 tablet (81 mg total) by mouth daily. 03/02/16   Quentin Angst, MD  benzonatate (TESSALON) 200 MG capsule Take 1 capsule (200 mg total) by mouth 2 (two) times daily as needed for cough. 02/14/19   Eustace Moore, MD    Family History Family History  Problem Relation Age of Onset  . Heart attack Father 78    Social History Social History   Tobacco Use  . Smoking status: Former Smoker    Packs/day: 0.50  . Smokeless tobacco: Never Used  Substance Use Topics  . Alcohol use: Yes    Alcohol/week: 0.0 standard drinks    Comment: occassional  . Drug use: Yes    Types: Marijuana     Allergies   Patient has no known allergies.   Review of Systems Review of Systems  Constitutional: Positive for fever. Negative for chills.  HENT: Positive for congestion. Negative for ear pain and sore throat.   Eyes: Negative for pain and visual disturbance.  Respiratory: Positive for cough. Negative for shortness of breath.   Cardiovascular: Negative for chest pain and palpitations.  Gastrointestinal: Negative for abdominal pain and vomiting.  Genitourinary: Negative for dysuria and hematuria.  Musculoskeletal: Positive for myalgias. Negative for arthralgias and back pain.  Skin: Negative for color change and rash.  Neurological: Negative for seizures and syncope.  All other systems reviewed and are negative.    Physical Exam  Triage Vital Signs ED Triage Vitals  Enc Vitals Group     BP 02/14/19 1409 128/86     Pulse Rate 02/14/19 1409 93     Resp 02/14/19 1409 16     Temp 02/14/19 1409 99.2 F (37.3 C)     Temp Source 02/14/19 1409 Oral     SpO2 02/14/19 1409 99 %     Weight 02/14/19 1412 231 lb (104.8 kg)     Height 02/14/19 1412 5\' 11"  (1.803 m)     Head Circumference --      Peak Flow --      Pain Score 02/14/19 1412 3     Pain Loc --      Pain Edu? --      Excl. in GC? --    No data found.  Updated Vital Signs BP 128/86 (BP Location: Right Arm)    Pulse 93   Temp 99.2 F (37.3 C) (Oral)   Resp 16   Ht 5\' 11"  (1.803 m)   Wt 104.8 kg   SpO2 99%   BMI 32.22 kg/m    Physical Exam Constitutional:      General: He is not in acute distress.    Appearance: He is well-developed. He is ill-appearing.     Comments: Appears tired  HENT:     Head: Normocephalic and atraumatic.     Right Ear: Tympanic membrane, ear canal and external ear normal.     Left Ear: Tympanic membrane, ear canal and external ear normal.     Nose: Rhinorrhea present.     Mouth/Throat:     Mouth: Mucous membranes are moist.     Pharynx: Posterior oropharyngeal erythema present.  Eyes:     Conjunctiva/sclera: Conjunctivae normal.     Pupils: Pupils are equal, round, and reactive to light.  Neck:     Musculoskeletal: Normal range of motion and neck supple.  Cardiovascular:     Rate and Rhythm: Normal rate and regular rhythm.     Heart sounds: Normal heart sounds.  Pulmonary:     Effort: Pulmonary effort is normal. No respiratory distress.     Breath sounds: Normal breath sounds.  Abdominal:     General: There is no distension.     Palpations: Abdomen is soft.  Musculoskeletal: Normal range of motion.  Lymphadenopathy:     Cervical: No cervical adenopathy.  Skin:    General: Skin is warm and dry.  Neurological:     Mental Status: He is alert.  Psychiatric:        Mood and Affect: Mood normal.        Behavior: Behavior normal.      UC Treatments / Results  Labs (all labs ordered are listed, but only abnormal results are displayed) Labs Reviewed - No data to display  EKG None  Radiology No results found.  Procedures Procedures (including critical care time)  Medications Ordered in UC Medications - No data to display  Initial Impression / Assessment and Plan / UC Course  I have reviewed the triage vital signs and the nursing notes.  Pertinent labs & imaging results that were available during my care of the patient were reviewed by me  and considered in my medical decision making (see chart for details).     I told the patient he does have a viral upper respiratory infection, not coronavirus.  Discussed symptomatic care.  Reasons for return Final Clinical Impressions(s) / UC Diagnoses   Final diagnoses:  Viral URI with cough     Discharge Instructions     Drink plenty of fluids Go home and rest Take Tessalon every 12 hours for coughing Take Mucinex DM in addition for nasal congestion Expect improvement in a few days   ED Prescriptions    Medication Sig Dispense Auth. Provider   benzonatate (TESSALON) 200 MG capsule Take 1 capsule (200 mg total) by mouth 2 (two) times daily as needed for cough. 20 capsule Eustace MooreNelson, Yvonne Sue, MD     Controlled Substance Prescriptions [place] Controlled Substance Registry consulted? Not Applicable   Eustace MooreNelson, Yvonne Sue, MD 02/14/19 [phone]

## 2019-02-14 NOTE — ED Triage Notes (Signed)
Per pt for 2 days now he has been feeling fatigue, with a very slight cough, not want ing to eat very much. Generalized body aches.

## 2019-04-23 ENCOUNTER — Ambulatory Visit (HOSPITAL_COMMUNITY)
Admission: EM | Admit: 2019-04-23 | Discharge: 2019-04-23 | Disposition: A | Discharge status: Other Acute Inpatient Hospital | Payer: BLUE CROSS/BLUE SHIELD

## 2019-04-23 ENCOUNTER — Other Ambulatory Visit: Payer: Self-pay

## 2019-06-04 ENCOUNTER — Other Ambulatory Visit: Payer: Self-pay

## 2019-06-04 ENCOUNTER — Encounter (HOSPITAL_COMMUNITY): Payer: Self-pay

## 2019-06-04 ENCOUNTER — Ambulatory Visit (HOSPITAL_COMMUNITY)
Admission: EM | Admit: 2019-06-04 | Discharge: 2019-06-04 | Disposition: A | Discharge status: Home / Self Care | Payer: BC Managed Care – PPO | Attending: Emergency Medicine | Admitting: Emergency Medicine

## 2019-06-04 DIAGNOSIS — S93492A Sprain of other ligament of left ankle, initial encounter: Secondary | ICD-10-CM

## 2019-06-04 NOTE — Discharge Instructions (Signed)
Is Ace wrap for additional support and pain relief. Important to rest, ice, compress, elevate ankle throughout the day to help with swelling, pain. Return if symptoms worsen/do not improve for reevaluation as x-ray may be indicated at that time. May take Tylenol, ibuprofen as needed for pain.

## 2019-06-04 NOTE — ED Provider Notes (Signed)
[employer]    CSN: [id number] Arrival date & time: 06/04/19  0910     History   Chief Complaint Chief Complaint  Patient presents with   Ankle Pain    HPI Jack Salas is a 48 y.o. male presenting for acute concern of left ankle/foot pain.  Patient states 2 days ago he was walking up his stairs when his toe caught and he tripped.  Patient thinks he rolled his ankle, though is not sure.  Patient denies head trauma, loss of consciousness.  No other areas of concern.  Patient states he is able to ambulate, though is cautious about going back to work where he is standing and walking a lot.  Patient feels he is not able to "pivot and move as quickly because it hurts ".    Past Medical History:  Diagnosis Date   Myocardial infarction [facility])    Tobacco abuse 02/14/2019    Patient Active Problem List   Diagnosis Date Noted   Tobacco abuse 02/14/2019   Essential hypertension 03/02/2016   Prediabetes 03/02/2016   Epistaxis 03/02/2016   S/P angioplasty with stent 10/29/2014   Acute anterior wall MI ([place]) 10/18/2014    Past Surgical History:  Procedure Laterality Date   LEFT HEART CATHETERIZATION WITH CORONARY ANGIOGRAM N/A 10/18/2014   Procedure: LEFT HEART CATHETERIZATION WITH CORONARY ANGIOGRAM;  Surgeon: Clent Demark, MD;  Location: [place] CATH LAB;  Service: Cardiovascular;  Laterality: N/A;   PERCUTANEOUS CORONARY STENT INTERVENTION (PCI-S)  10/18/2014   Procedure: PERCUTANEOUS CORONARY STENT INTERVENTION (PCI-S);  Surgeon: Clent Demark, MD;  Location: [facility] CATH LAB;  Service: Cardiovascular;;  prox LAD       Home Medications    Prior to Admission medications   Not on File    Family History Family History  Problem Relation Age of Onset   Heart attack Father 66    Social History Social History   Tobacco Use   Smoking status: Former Smoker    Packs/day: 0.50   Smokeless tobacco: Never Used  Substance Use Topics   Alcohol use: Yes   Alcohol/week: 0.0 standard drinks    Comment: occassional   Drug use: Yes    Types: Marijuana     Allergies   Patient has no known allergies.   Review of Systems Review of Systems  Respiratory: Negative for cough and shortness of breath.   Cardiovascular: Negative for chest pain and palpitations.  Musculoskeletal: Negative for gait problem.       Positive for left foot/ankle pain  Neurological: Negative for syncope and weakness.  Hematological: Does not bruise/bleed easily.     Physical Exam Triage Vital Signs ED Triage Vitals  Enc Vitals Group     BP      Pulse      Resp      Temp      Temp src      SpO2      Weight      Height      Head Circumference      Peak Flow      Pain Score      Pain Loc      Pain Edu?      Excl. in [place]?    No data found.  Updated Vital Signs BP 100/62 (BP Location: Right Arm)    Temp 98.3 F (36.8 C) (Oral)    Resp 18    Wt 230 lb (104.3 kg)    SpO2 98%  BMI 32.08 kg/m   Visual Acuity Right Eye Distance:   Left Eye Distance:   Bilateral Distance:    Right Eye Near:   Left Eye Near:    Bilateral Near:     Physical Exam Constitutional:      General: He is not in acute distress. HENT:     Head: Normocephalic and atraumatic.  Eyes:     General: No scleral icterus.    Pupils: Pupils are equal, round, and reactive to light.  Cardiovascular:     Rate and Rhythm: Normal rate.  Pulmonary:     Effort: Pulmonary effort is normal.  Musculoskeletal:     Comments: Left ankle and foot without significant edema, ecchymosis, erythema.  No open wounds.  Patient is tender to palpation over anterior ankle/foot.  No malleoli or bony tenderness.  Negative squeeze test.  Patient has full active range of motion, endorses some discomfort with dorsiflexion.  Strength is slightly decreased as compared to right.  DP pulses intact bilaterally.  Sensation intact.  Vascularly intact bilaterally and symmetric.  Patient is able to ambulate with mild  pain.  Skin:    Coloration: Skin is not jaundiced or pale.  Neurological:     Mental Status: He is alert and oriented to person, place, and time.      UC Treatments / Results  Labs (all labs ordered are listed, but only abnormal results are displayed) Labs Reviewed - No data to display  EKG   Radiology No results found.  Procedures Procedures (including critical care time)  Medications Ordered in UC Medications - No data to display  Initial Impression / Assessment and Plan / UC Course  I have reviewed the triage vital signs and the nursing notes.  Pertinent labs & imaging results that were available during my care of the patient were reviewed by me and considered in my medical decision making (see chart for details).     48 year old male presenting for left ankle/foot pain status post falling upstairs.  Exam reassuring, low suspicion for fracture so radiology deferred though would reconsider if patient's symptoms do not improve/worsen over the next week.  Ace wrap given in office, for patient to use while at work.  Return precautions discussed, patient is understanding and is agreeable to plan Final Clinical Impressions(s) / UC Diagnoses   Final diagnoses:  Sprain of other ligament of left ankle, initial encounter     Discharge Instructions     Is Ace wrap for additional support and pain relief. Important to rest, ice, compress, elevate ankle throughout the day to help with swelling, pain. Return if symptoms worsen/do not improve for reevaluation as x-ray may be indicated at that time. May take Tylenol, ibuprofen as needed for pain.    ED Prescriptions    None     Controlled Substance Prescriptions [place] Controlled Substance Registry consulted? Not Applicable   Shea EvansHall-Potvin, Corbyn Wildey, [place]-C 06/04/19 [id number]

## 2019-06-04 NOTE — ED Triage Notes (Signed)
Pt states he was going up some steps at home and he rolled his left ankle. X 2 days. Pt states he has been soaking his foot.

## 2019-08-25 IMAGING — DX DG FINGER THUMB 2+V*R*
4 series · 4 of 4 positions shown · non-contrast
Comparison: None.

CLINICAL DATA: Pain RIGHT thumb.  Repetitive motion.

EXAM:
RIGHT THUMB 2+V

[finger pa (1 of 2)]
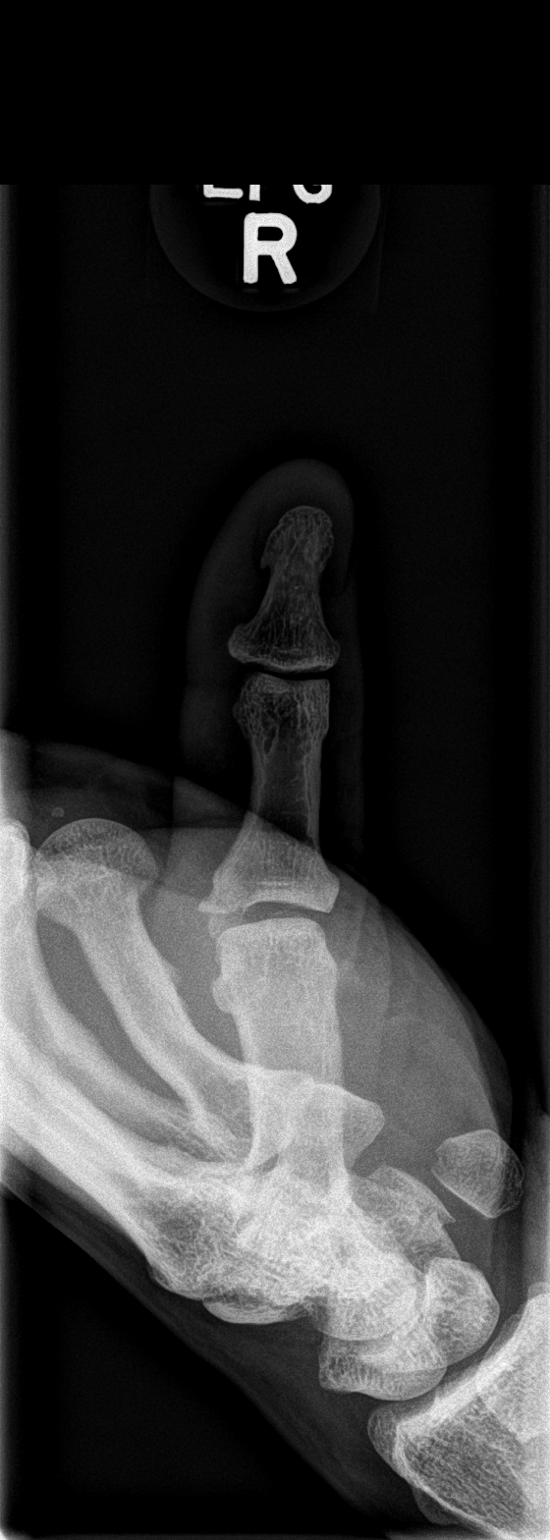

[finger obl]
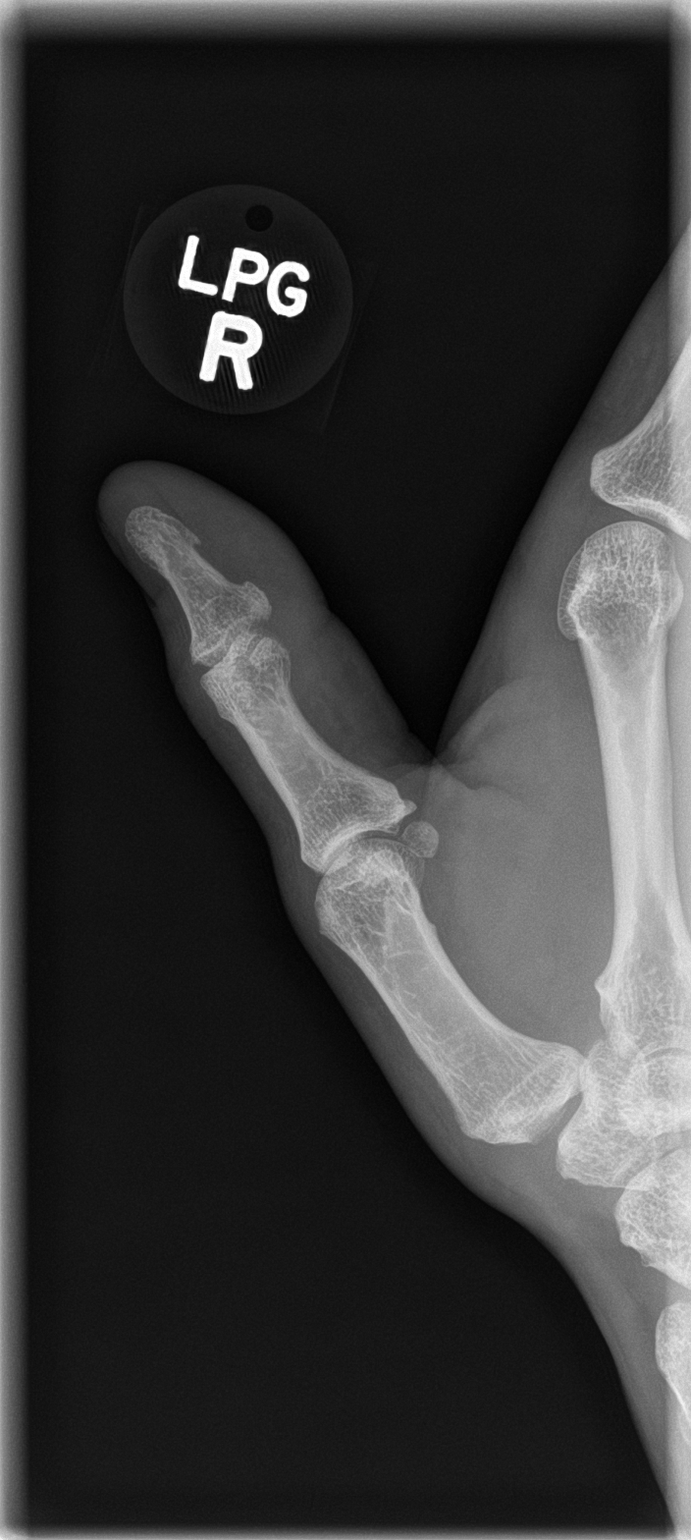

[finger lat]
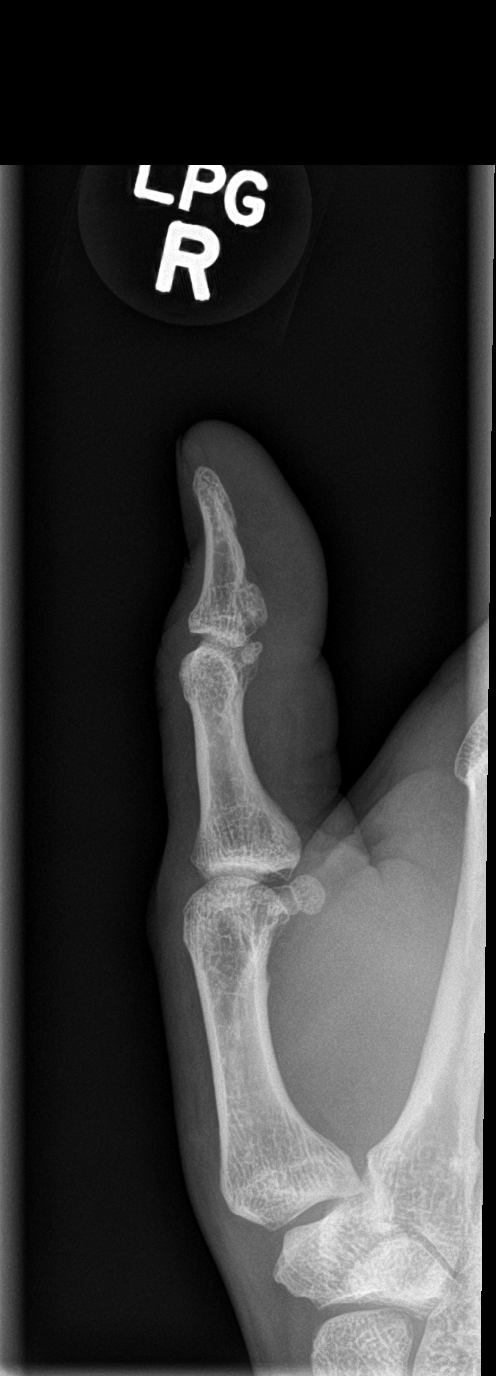

[finger pa (2 of 2)]
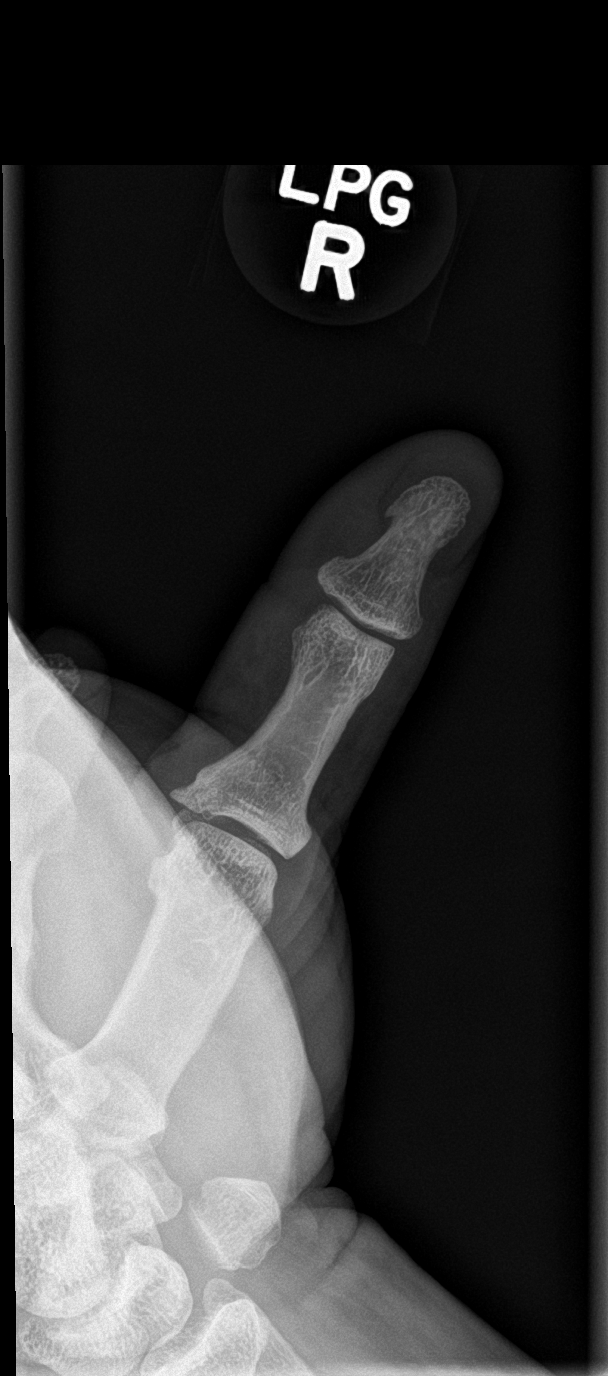

[4 of 4 positions shown; findings below may reference images not displayed]

FINDINGS: No fracture dislocation. Osteophyte along the ulnar margin of the
proximal phalanx at the metacarpal phalangeal joint. No soft tissue
abnormality.
IMPRESSION: No acute osseous abnormality.

Mild osteophytosis of the proximal phalanx as above

## 2020-11-16 ENCOUNTER — Encounter (HOSPITAL_COMMUNITY): Payer: Self-pay

## 2020-11-16 ENCOUNTER — Emergency Department (HOSPITAL_COMMUNITY)
Admission: EM | Admit: 2020-11-16 | Discharge: 2020-11-16 | Disposition: A | Discharge status: Home / Self Care | Payer: BLUE CROSS/BLUE SHIELD | Attending: Emergency Medicine | Admitting: Emergency Medicine

## 2020-11-16 ENCOUNTER — Other Ambulatory Visit: Payer: Self-pay

## 2020-11-16 DIAGNOSIS — Z87891 Personal history of nicotine dependence: Secondary | ICD-10-CM | POA: Insufficient documentation

## 2020-11-16 DIAGNOSIS — Z955 Presence of coronary angioplasty implant and graft: Secondary | ICD-10-CM | POA: Insufficient documentation

## 2020-11-16 DIAGNOSIS — R300 Dysuria: Secondary | ICD-10-CM

## 2020-11-16 DIAGNOSIS — R369 Urethral discharge, unspecified: Secondary | ICD-10-CM | POA: Insufficient documentation

## 2020-11-16 DIAGNOSIS — I10 Essential (primary) hypertension: Secondary | ICD-10-CM | POA: Insufficient documentation

## 2020-11-16 LAB — URINALYSIS, ROUTINE W REFLEX MICROSCOPIC
Bilirubin Urine: NEGATIVE
Glucose, UA: NEGATIVE mg/dL
Hgb urine dipstick: NEGATIVE
Ketones, ur: NEGATIVE mg/dL
Nitrite: NEGATIVE
Protein, ur: NEGATIVE mg/dL
Specific Gravity, Urine: 1.027 (ref 1.005–1.030)
WBC, UA: >50 WBC/hpf — ABNORMAL HIGH (ref 0–5)
pH: 5 (ref 5.0–8.0)

## 2020-11-16 LAB — HIV ANTIBODY (ROUTINE TESTING W REFLEX): HIV Screen 4th Generation wRfx: NONREACTIVE

## 2020-11-16 MED ORDER — DOXYCYCLINE HYCLATE 100 MG PO TABS
100.0000 mg | ORAL_TABLET | Freq: Once | ORAL | Status: AC
  Administered 2020-11-16: 100 mg via ORAL
  Filled 2020-11-16: qty 1

## 2020-11-16 MED ORDER — CEFTRIAXONE SODIUM 1 G IJ SOLR
500.0000 mg | Freq: Once | INTRAMUSCULAR | Status: AC
  Administered 2020-11-16: 500 mg via INTRAMUSCULAR
  Filled 2020-11-16: qty 10

## 2020-11-16 MED ORDER — DOXYCYCLINE HYCLATE 100 MG PO CAPS: 100.0000 mg | ORAL_CAPSULE | Freq: Two times a day (BID) | ORAL | 0 refills | Status: AC

## 2020-11-16 MED ORDER — LIDOCAINE HCL (PF) 1 % IJ SOLN
1.0000 mL | Freq: Once | INTRAMUSCULAR | Status: AC
  Administered 2020-11-16: 1 mL
  Filled 2020-11-16: qty 30

## 2020-11-16 NOTE — Discharge Instructions (Signed)
Your test for gonorrhea, chlamydia, HIV, and syphilis are pending. If your test for gonorrhea or chlamydia are positive, you'll receive a phone call. You have already been treated, you do not need further treatment. If your test for HIV or syphilis is positive, you'll also receive a phone call, and will need treatment for this. Abstain from sex for the next 1 to 2 weeks to prevent spreading infection. If any of your tests are positive, your partner will also need to be informed and treated. Return to the emergency room if you develop high fevers, persistent vomiting, severe worsening abdominal pain, inability urinate, or any new or worsening, or concerning symptoms

## 2020-11-16 NOTE — ED Provider Notes (Signed)
[facility]-EMERGENCY DEPT Provider Note   CSN: [id number] Arrival date & time: 11/16/20  1328     History Chief Complaint  Patient presents with  . Exposure to STD    Jack Salas is a 49 y.o. male presenting for evaluation of dysuria and penile discharge.  Patient states of a days ago his condom broke during sexual intercourse. Since then, he has been having dysuria and penile discharge which developed today. He states his dysuria has been constant for the past 2 days. He denies fevers, chills, nausea, vomiting or abdominal pain. He is sexually active with one male partner who is symptom-free. He normally uses condoms. He has no other medical problems, takes no medications daily.  HPI     Past Medical History:  Diagnosis Date  . Myocardial infarction (H[facility])   . Tobacco abuse 02/14/2019    Patient Active Problem List   Diagnosis Date Noted  . Tobacco abuse 02/14/2019  . Essential hypertension 03/02/2016  . Prediabetes 03/02/2016  . Epistaxis 03/02/2016  . S/P angioplasty with stent 10/29/2014  . Acute anterior wall MI ([facility]) 10/18/2014    Past Surgical History:  Procedure Laterality Date  . LEFT HEART CATHETERIZATION WITH CORONARY ANGIOGRAM N/A 10/18/2014   Procedure: LEFT HEART CATHETERIZATION WITH CORONARY ANGIOGRAM;  Surgeon: Robynn Pane, MD;  Location: [facility] CATH LAB;  Service: Cardiovascular;  Laterality: N/A;  . PERCUTANEOUS CORONARY STENT INTERVENTION (PCI-S)  10/18/2014   Procedure: PERCUTANEOUS CORONARY STENT INTERVENTION (PCI-S);  Surgeon: Robynn Pane, MD;  Location: [facility] CATH LAB;  Service: Cardiovascular;;  prox LAD       Family History  Problem Relation Age of Onset  . Heart attack Father 4    Social History   Tobacco Use  . Smoking status: Former Smoker    Packs/day: 0.50  . Smokeless tobacco: Never Used  Substance Use Topics  . Alcohol use: Yes    Alcohol/week: 0.0 standard drinks    Comment: occassional  . Drug  use: Yes    Types: Marijuana    Home Medications Prior to Admission medications   Medication Sig Start Date End Date Taking? Authorizing Provider  doxycycline (VIBRAMYCIN) 100 MG capsule Take 1 capsule (100 mg total) by mouth 2 (two) times daily for 7 days. 11/16/20 11/23/20  Gusta Marksberry, PA-C    Allergies    Patient has no known allergies.  Review of Systems   Review of Systems  Constitutional: Negative for fever.  Genitourinary: Positive for dysuria and penile discharge.    Physical Exam Updated Vital Signs BP (!) 144/88 (BP Location: Right Arm)   Pulse 80   Temp 98.5 F (36.9 C) (Oral)   Resp 18   Ht 5\' 11"  (1.803 m)   Wt 105.2 kg   SpO2 97%   BMI 32.36 kg/m   Physical Exam Vitals and nursing note reviewed. Exam conducted with a chaperone present.  Constitutional:      General: He is not in acute distress.    Appearance: He is well-developed and well-nourished.     Comments: Sitting in the bed in no acute distress  HENT:     Head: Normocephalic and atraumatic.  Eyes:     Extraocular Movements: EOM normal.  Cardiovascular:     Rate and Rhythm: Normal rate and regular rhythm.     Pulses: Normal pulses.  Pulmonary:     Effort: Pulmonary effort is normal.     Breath sounds: Normal breath sounds.  Abdominal:  General: There is no distension.     Palpations: Abdomen is soft. There is no mass.     Tenderness: There is no abdominal tenderness. There is no right CVA tenderness, left CVA tenderness, guarding or rebound.     Comments: No TTP of the abdomen. No rigidity, guarding, distention. Negative rebound. No peritonitis. No CVA tenderness.  Genitourinary:    Penis: Circumcised. Discharge present.      Testes: Normal.     Epididymis:     Right: Normal.     Left: Normal.     Comments: Minimal white discharge noted from the penis. No penile pain. No testicular pain or swelling. No pain along the epididymis. No inguinal lymphadenopathy. No  ulcerations. Musculoskeletal:        General: Normal range of motion.     Cervical back: Normal range of motion.  Lymphadenopathy:     Lower Body: No right inguinal adenopathy. No left inguinal adenopathy.  Skin:    General: Skin is warm.     Findings: No rash.  Neurological:     Mental Status: He is alert and oriented to person, place, and time.  Psychiatric:        Mood and Affect: Mood and affect normal.     ED Results / Procedures / Treatments   Labs (all labs ordered are listed, but only abnormal results are displayed) Labs Reviewed  URINALYSIS, ROUTINE W REFLEX MICROSCOPIC  RPR  HIV ANTIBODY (ROUTINE TESTING W REFLEX)  GC/CHLAMYDIA PROBE AMP ([place]) NOT AT [facility]    EKG None  Radiology No results found.  Procedures Procedures (including critical care time)  Medications Ordered in ED Medications  cefTRIAXone (ROCEPHIN) injection 500 mg (500 mg Intramuscular Given 11/16/20 1559)  lidocaine (PF) (XYLOCAINE) 1 % injection 1 mL (1 mL Other Given 11/16/20 1604)  doxycycline (VIBRA-TABS) tablet 100 mg (100 mg Oral Given 11/16/20 1559)    ED Course  I have reviewed the triage vital signs and the nursing notes.  Pertinent labs & imaging results that were available during my care of the patient were reviewed by me and considered in my medical decision making (see chart for details).    MDM Rules/Calculators/A&P                          Patient presenting for evaluation of dysuria and penile discharge. On exam, patient appears nontoxic. No systemic signs of infection, vitals are stable. He does have minimal discharge noted on GU exam. As patient is having symptoms, will treat for STDs while tests are pending. Patient is aware that results are pending. Discussed follow-up with health department as needed. At this time, patient appears safe for discharge. Return precautions given. Patient states he understands and agrees to plan.  Final Clinical Impression(s) / ED  Diagnoses Final diagnoses:  Dysuria  Penile discharge    Rx / DC Orders ED Discharge Orders         Ordered    doxycycline (VIBRAMYCIN) 100 MG capsule  2 times daily        11/16/20 1613           Alveria Apley, PA-C 11/16/20 1614    Tegeler, Canary Brim, MD 11/16/20 1714

## 2020-11-16 NOTE — ED Notes (Signed)
Discharge paperwork reviewed with pt, including prescriptions.  Pt advised that he received first dose of antibiotic here at ED and to follow prescription as prescribed.  Pt with no questions or concerns at this time, ambulatory to ED entrance.

## 2020-11-16 NOTE — ED Triage Notes (Signed)
Pt reports having sex about 3-4 days ago and the condom broke. Pt states that he started having burning with urination last night and noticed green discharge this morning.

## 2020-11-17 LAB — GC/CHLAMYDIA PROBE AMP (~~LOC~~) NOT AT ARMC
Chlamydia: NEGATIVE
Comment: NEGATIVE
Comment: NORMAL
Neisseria Gonorrhea: POSITIVE — AB

## 2020-11-17 LAB — RPR: RPR Ser Ql: NONREACTIVE

## 2022-07-24 ENCOUNTER — Encounter (HOSPITAL_COMMUNITY): Payer: Self-pay

## 2022-07-24 ENCOUNTER — Emergency Department (HOSPITAL_COMMUNITY)
Admission: EM | Admit: 2022-07-24 | Discharge: 2022-07-24 | Disposition: A | Discharge status: Home / Self Care | Payer: Commercial Managed Care - HMO | Attending: Emergency Medicine | Admitting: Emergency Medicine

## 2022-07-24 DIAGNOSIS — N6310 Unspecified lump in the right breast, unspecified quadrant: Secondary | ICD-10-CM | POA: Insufficient documentation

## 2022-07-24 DIAGNOSIS — R0789 Other chest pain: Secondary | ICD-10-CM | POA: Diagnosis present

## 2022-07-24 NOTE — ED Triage Notes (Signed)
Patient reports that he has had a right breast lump x 1 year and worsening in the past 6 months.

## 2022-07-24 NOTE — Discharge Instructions (Addendum)
You need to follow-up with a breast center.  You may do so with the [facility] health office or contact any center of your choosing.  [facility], [facility] and [facility] also have breast centers.  It was a pleasure to meet you and I hope that you feel better.  Tylenol and ibuprofen may be utilized for any pain and swelling.

## 2022-07-24 NOTE — ED Provider Notes (Signed)
   [facility]   CSN: [id number] Arrival date & time: 07/24/22  1139     History  Chief Complaint  Patient presents with   Breast Mass    Jack Salas is a 51 y.o. male presenting with right chest mass and tenderness.  Reports its been there for a year but has been worsening recently.  No nipple discharge.  No personal history of cancer however does report family history of breast cancer in his maternal great aunt. HPI     Home Medications Prior to Admission medications   Not on File      Allergies    Patient has no known allergies.    Review of Systems   Review of Systems  Physical Exam Updated Vital Signs BP (!) 140/99   Pulse 78   Temp 98.2 F (36.8 C) (Oral)   Resp 18   Ht 6' (1.829 m)   Wt 98.5 kg   SpO2 97%   BMI 29.44 kg/m  Physical Exam Vitals and nursing note reviewed.  Constitutional:      Appearance: Normal appearance.  HENT:     Head: Normocephalic and atraumatic.  Eyes:     General: No scleral icterus.    Conjunctiva/sclera: Conjunctivae normal.  Pulmonary:     Effort: Pulmonary effort is normal. No respiratory distress.  Musculoskeletal:     Comments: Tenderness and what feels like fibrotic breast tissue nipple.  No nipple discharge.  Skin:    Findings: No rash.  Neurological:     Mental Status: He is alert.  Psychiatric:        Mood and Affect: Mood normal.     ED Results / Procedures / Treatments   Labs (all labs ordered are listed, but only abnormal results are displayed) Labs Reviewed - No data to display  EKG None  Radiology No results found.  Procedures Procedures   Medications Ordered in ED Medications - No data to display  ED Course/ Medical Decision Making/ A&P                           Medical Decision Making  51 year old male presenting with 1 year worth of right chest mass.  Exam with a mobile mass versus breast tissue.  We do not have correct imaging or  specialist to further work-up this patient today.  I spoke about this with my attending, Dr. Jeraldine Loots, who agrees that its reasonable to discharge him with information on where to go for this concern.  Patient and his mother are thankful and agreeable to discharge at this time.   Final Clinical Impression(s) / ED Diagnoses Final diagnoses:  Mass of right breast, unspecified quadrant    Rx / DC Orders ED Discharge Orders     None      Results and diagnoses were explained to the patient. Return precautions discussed in full. Patient had no additional questions and expressed complete understanding.   This chart was dictated using voice recognition software.  Despite best efforts to proofread,  errors can occur which can change the documentation meaning.    Woodroe Chen 07/24/22 1240    Gerhard Munch, MD 07/24/22 [phone]

## 2022-07-26 ENCOUNTER — Ambulatory Visit: Payer: Self-pay | Admitting: *Deleted

## 2022-07-26 NOTE — Telephone Encounter (Signed)
  Chief Complaint: Mass Symptoms: Mass of right breast, "A handful, hard around edge" Seen in eD 07/24/22, advised to F/U with PCP. No PCP Frequency: 1 year, growing, painful Pertinent Negatives: Patient denies fever, nipple discharge Disposition: [] ED /[] Urgent Care (no appt availability in office) / [x] Appointment(In office/virtual)/ []  [place] [facility]/ [] Home Care/ [] Refused Recommended Disposition /[] [place] [facility]/ []  Follow-up with PCP Additional Notes: Directed to [facility]. Numbers and info provided for both Clinics. Care advise provided, pt verbalizes understanding.  Reason for Disposition  Breast lump  Answer Assessment - Initial Assessment Questions 1. SYMPTOM: "What's the main symptom you're concerned about?"  (e.g., lump, pain, rash, nipple discharge)     Mass 2. LOCATION: "Where is the located?"     Right breast, "Handfull" 3. ONSET: "When did   start?"     1 year ago 4. PRIOR HISTORY: "Do you have any history of prior problems with your breasts?" (e.g., lumps, cancer, fibrocystic breast disease)     No 5. CAUSE: "What do you think is causing this symptom?"     Unsure 6. OTHER SYMPTOMS: "Do you have any other symptoms?" (e.g., fever, breast pain, redness or rash, nipple discharge)     Outside hard  Protocols used: Breast Symptoms-A-AH

## 2022-08-15 ENCOUNTER — Encounter: Payer: Self-pay | Admitting: Student

## 2022-08-15 ENCOUNTER — Ambulatory Visit (INDEPENDENT_AMBULATORY_CARE_PROVIDER_SITE_OTHER): Payer: Commercial Managed Care - HMO | Admitting: Student

## 2022-08-15 VITALS — BP 134/93 | HR 75 | Temp 98.1°F | Ht 71.0 in | Wt 228.0 lb

## 2022-08-15 DIAGNOSIS — R7303 Prediabetes: Secondary | ICD-10-CM | POA: Diagnosis not present

## 2022-08-15 DIAGNOSIS — Z23 Encounter for immunization: Secondary | ICD-10-CM

## 2022-08-15 DIAGNOSIS — I252 Old myocardial infarction: Secondary | ICD-10-CM | POA: Diagnosis not present

## 2022-08-15 DIAGNOSIS — F1090 Alcohol use, unspecified, uncomplicated: Secondary | ICD-10-CM | POA: Insufficient documentation

## 2022-08-15 DIAGNOSIS — F17210 Nicotine dependence, cigarettes, uncomplicated: Secondary | ICD-10-CM | POA: Diagnosis not present

## 2022-08-15 DIAGNOSIS — Z1159 Encounter for screening for other viral diseases: Secondary | ICD-10-CM

## 2022-08-15 DIAGNOSIS — Z789 Other specified health status: Secondary | ICD-10-CM

## 2022-08-15 DIAGNOSIS — Z0000 Encounter for general adult medical examination without abnormal findings: Secondary | ICD-10-CM

## 2022-08-15 DIAGNOSIS — Z720 Tobacco use: Secondary | ICD-10-CM

## 2022-08-15 DIAGNOSIS — F17200 Nicotine dependence, unspecified, uncomplicated: Secondary | ICD-10-CM

## 2022-08-15 DIAGNOSIS — N6310 Unspecified lump in the right breast, unspecified quadrant: Secondary | ICD-10-CM

## 2022-08-15 DIAGNOSIS — E7800 Pure hypercholesterolemia, unspecified: Secondary | ICD-10-CM

## 2022-08-15 LAB — POCT GLYCOSYLATED HEMOGLOBIN (HGB A1C): Hemoglobin A1C: 5.6 % (ref 4.0–5.6)

## 2022-08-15 LAB — GLUCOSE, CAPILLARY: Glucose-Capillary: 141 mg/dL — ABNORMAL HIGH (ref 70–99)

## 2022-08-15 NOTE — Assessment & Plan Note (Signed)
Patient has a history of prediabetes with a1c=6.2 in 2015. Patient's a1c was improved today at 5.6.  Plan: -Continue to monitor -No further workup today

## 2022-08-15 NOTE — Assessment & Plan Note (Signed)
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 He expresses interest in cutting down and has not tried gum or patches before, but would like to think about it and follow up next visit.  Plan: -Follow up smoking cessation next visit

## 2022-08-15 NOTE — Assessment & Plan Note (Addendum)
Patient reports that he noticed a non-tender breast mass about one year ago and he denies any trauma or inciting event at onset. Patient says about 6-7 months ago the mass became painful and that it feels more tender to palpation and hurts all the time. He denies any skin changes or nipple discharge, denies any headaches or changes in vision. Denies prior history of masses or cancer. His only current medication is aspirin. Family history significant for breast cancer in maternal aunt. Patient presented to the ED three weeks ago for the breast mass, and they were unable to do further workup and recommended he follow up with a breast center.  Exam shows 8-10cm soft and mobile right breast mass that is tender to palpation. POCUS of right breast did not reveal any calcifications or fluid-filled cysts.   A&P: Patient presents today for evaluation of unilateral tender right breast mass of one year duration. Due to patient's history and size of mass, plan is to get diagnostic breast U/S and mammography to r/o possible malignancy. Will also get labs today to evaluate for potential endocrine etiology.  Plan: -Breast U/S -Mammogram -Testosterone, Estradiol, LH -Follow up in 6 months

## 2022-08-15 NOTE — Assessment & Plan Note (Addendum)
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 Patient is accompanied by his mother today, who thinks he drinks more than this. He acknowledges that life stressors contribute to his drinking habits. Patient states that he "absolutely wants to cut down" and has set a personal goal to decrease by his birthday next year and wants to wean down in the meantime.  A&P: Patient seems to be determined to change but has not taken any actions yet. We briefly discussed support is available with behavioral and/or medication therapy and patient expressed interest and wants to think about it. Plan: -Follow up at next visit  ADDENDUM: LFT's mildly elevated, likely due to alcohol use. Will need to continue monitoring. No clinical signs of cirrhosis.

## 2022-08-15 NOTE — Patient Instructions (Addendum)
Thank you for seeing [place] today Jack Salas!   Breast Mass We are ordering an ultrasound and mammogram to look at your breast mass, and you should receive a call from their office to schedule.   Labwork We are checking some labs for you today to look at your blood sugar, kidney function, liver function, cholesterol, and hormone levels. We will call you with those results.   Please give [place] a call if you have any questions or concerns! We will plan to follow up with you in 6 months.

## 2022-08-15 NOTE — Progress Notes (Signed)
Subjective:   Patient ID: Jack Salas male   DOB: [birth year] 51 y.o.   MRN: [id number]  HPI: [name] is a 51 y.o. with a history of MI in 2015 who presents to [facility] to establish care and has an acute concern about a right breast mass.   Patient recently moved back to [facility] from [place] and is currently living with his mother while settling in the area. 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 He has not followed with a PCP for many years and reports no chronic medical conditions. Brief medical history summarized below:  Medical history: MI in 2015 s/p PCI. No other hospitalizations. Surgical History: none  Family History: Breast cancer in maternal aunt. Father passed from MI at age 63. Mom has history of diabetes.  Tobacco - smokes ~1/4 pack day for >10 years.  Alcohol - drinks 1/2 pint of liquor a day for 7-8 years.  Recreational substances - Marijuana only  Please see Plan for individualized problem-based charting.  Patient Active Problem List   Diagnosis Date Noted   Breast mass, right 08/15/2022   Heavy alcohol use 08/15/2022   Tobacco abuse 02/14/2019   Essential hypertension 03/02/2016   Prediabetes 03/02/2016   Epistaxis 03/02/2016   S/P angioplasty with stent 10/29/2014   Acute anterior wall MI ([place]) 10/18/2014     Current Outpatient Medications  Medication Sig Dispense Refill   aspirin EC 81 MG tablet Take 81 mg by mouth daily. Swallow whole.     No current facility-administered medications for this visit.     Review of Systems: Review of systems is negative other than what is noted in individual problem-based charting.    Objective:   Physical Exam: Vitals:   08/15/22 1048  BP: (!) 134/93  Pulse: 75  Temp: 98.1 F (36.7 C)  TempSrc: Oral  SpO2: 99%  Weight: 228 lb (103.4 kg)  Height: 5\' 11"  (1.803 m)   Physical Exam Vitals reviewed.  Constitutional:      General: He is not in acute distress. Cardiovascular:     Rate and Rhythm:  Normal rate and regular rhythm.     Heart sounds: No murmur heard. Pulmonary:     Effort: Pulmonary effort is normal. No respiratory distress.     Breath sounds: No wheezing.  Chest:  Breasts:    Right: Mass and tenderness present. No inverted nipple, nipple discharge or skin change.     Left: Normal.     Comments: Right sub-areolar mass appreciated on palpation. Mass is soft and mobile, approx 8-10cm in diameter. Right areola is slightly enlarged. No skin changes, erythema, or nipple discharge seen. No lymphadenopathy appreciated.  Abdominal:     General: Abdomen is flat. There is no distension.     Palpations: Abdomen is soft.     Tenderness: There is no abdominal tenderness. There is no guarding.  Skin:    General: Skin is warm and dry.  Neurological:     Mental Status: He is alert and oriented to person, place, and time.      Assessment & Plan:   Breast mass, right Patient reports that he noticed a non-tender breast mass about one year ago and he denies any trauma or inciting event at onset. Patient says about 6-7 months ago the mass became painful and that it feels more tender to palpation and hurts all the time. He denies any skin changes or nipple discharge, denies any headaches or changes in vision.  Denies prior history of masses or cancer. His only current medication is aspirin. Family history significant for breast cancer in maternal aunt. Patient presented to the ED three weeks ago for the breast mass, and they were unable to do further workup and recommended he follow up with a breast center.  Exam shows 8-10cm soft and mobile right breast mass that is tender to palpation. POCUS of right breast did not reveal any calcifications or fluid-filled cysts.   A&P: Patient presents today for evaluation of unilateral tender right breast mass of one year duration. Due to patient's history and size of mass, plan is to get diagnostic breast U/S and mammography to r/o possible malignancy.  Will also get labs today to evaluate for potential endocrine etiology.  Plan: -Breast U/S -Mammogram -Testosterone, Estradiol, LH -Follow up in 6 months    Heavy alcohol use Patient reports he drinks about 1/2 pint of liquor every day for the last 7-8 years. Patient is accompanied by his mother today, who thinks he drinks more than this. He acknowledges that life stressors contribute to his drinking habits. Patient states that he "absolutely wants to cut down" and has set a personal goal to decrease by his birthday next year and wants to wean down in the meantime.  A&P: Patient seems to be determined to change but has not taken any actions yet. We briefly discussed support is available with behavioral and/or medication therapy and patient expressed interest and wants to think about it. Plan: -Follow up at next visit  Tobacco abuse Patient has a history of smoking about 1/4 pack a day for >10 years and says he generally smokes due to working third shift. He expresses interest in cutting down and has not tried gum or patches before, but would like to think about it and follow up next visit.  Plan: -Follow up smoking cessation next visit   Prediabetes Patient has a history of prediabetes with a1c=6.2 in 2015. Patient's a1c was improved today at 5.6.  Plan: -Continue to monitor -No further workup today

## 2022-08-16 DIAGNOSIS — Z0000 Encounter for general adult medical examination without abnormal findings: Secondary | ICD-10-CM | POA: Insufficient documentation

## 2022-08-16 NOTE — Assessment & Plan Note (Signed)
-   Flu shot given today - HCV screening today

## 2022-08-16 NOTE — Assessment & Plan Note (Signed)
Remote history of MI, with stent. We do not have records for Jack Salas. He has been chest pain free, no recent symptoms. Plan to continue aspirin, will obtain lipid panel and A1c today as well.   - Continue aspirin - Follow-up lipid panel, A1c

## 2022-08-18 DIAGNOSIS — E785 Hyperlipidemia, unspecified: Secondary | ICD-10-CM | POA: Insufficient documentation

## 2022-08-18 NOTE — Assessment & Plan Note (Signed)
Lipid panel obtained during visit revealed LDL 147. Goal <70 for secondary prevention. Will plan to initiate high-intensity statin. Will need to monitor LFT's in setting of this.  - Will discuss with patient starting atorvastatin 40mg  daily - Repeat lipid panel in one year

## 2022-08-19 LAB — LIPID PANEL
Chol/HDL Ratio: 3.6 ratio (ref 0.0–5.0)
Cholesterol, Total: 232 mg/dL — ABNORMAL HIGH (ref 100–199)
HDL: 65 mg/dL
LDL Chol Calc (NIH): 147 mg/dL — ABNORMAL HIGH (ref 0–99)
Triglycerides: 111 mg/dL (ref 0–149)
VLDL Cholesterol Cal: 20 mg/dL (ref 5–40)

## 2022-08-19 LAB — CMP14 + ANION GAP
ALT: 69 IU/L — ABNORMAL HIGH (ref 0–44)
AST: 54 IU/L — ABNORMAL HIGH (ref 0–40)
Albumin/Globulin Ratio: 1.7 (ref 1.2–2.2)
Albumin: 4.2 g/dL (ref 3.8–4.9)
Alkaline Phosphatase: 99 IU/L (ref 44–121)
Anion Gap: 16 mmol/L (ref 10.0–18.0)
BUN/Creatinine Ratio: 9 (ref 9–20)
BUN: 7 mg/dL (ref 6–24)
Bilirubin Total: 0.6 mg/dL (ref 0.0–1.2)
CO2: 24 mmol/L (ref 20–29)
Calcium: 9.1 mg/dL (ref 8.7–10.2)
Chloride: 100 mmol/L (ref 96–106)
Creatinine, Ser: 0.79 mg/dL (ref 0.76–1.27)
Globulin, Total: 2.5 g/dL (ref 1.5–4.5)
Glucose: 127 mg/dL — ABNORMAL HIGH (ref 70–99)
Potassium: 4 mmol/L (ref 3.5–5.2)
Sodium: 140 mmol/L (ref 134–144)
Total Protein: 6.7 g/dL (ref 6.0–8.5)
eGFR: 107 mL/min/1.73

## 2022-08-19 LAB — CBC
Hematocrit: 42 % (ref 37.5–51.0)
Hemoglobin: 13.4 g/dL (ref 13.0–17.7)
MCH: 25.5 pg — ABNORMAL LOW (ref 26.6–33.0)
MCHC: 31.9 g/dL (ref 31.5–35.7)
MCV: 80 fL (ref 79–97)
Platelets: 343 10*3/uL (ref 150–450)
RBC: 5.26 x10E6/uL (ref 4.14–5.80)
RDW: 14.9 % (ref 11.6–15.4)
WBC: 7.8 10*3/uL (ref 3.4–10.8)

## 2022-08-19 LAB — TESTOSTERONE: Testosterone: 282 ng/dL (ref 264–916)

## 2022-08-19 LAB — LUTEINIZING HORMONE: LH: 3.5 m[IU]/mL (ref 1.7–8.6)

## 2022-08-19 LAB — HCV INTERPRETATION

## 2022-08-19 LAB — HCV AB W REFLEX TO QUANT PCR: HCV Ab: NONREACTIVE

## 2022-08-19 LAB — ESTROGENS, TOTAL: Estrogen: 200 pg/mL (ref 56–213)

## 2022-08-23 NOTE — Progress Notes (Signed)
Internal Medicine Clinic Attending  Case discussed with Dr. Braswell and MS 3 Nelson  at the time of the visit.  We reviewed the resident's history and exam and pertinent patient test results.  I agree with the assessment, diagnosis, and plan of care documented in the resident's note.  

## 2022-08-30 ENCOUNTER — Ambulatory Visit
Admission: RE | Admit: 2022-08-30 | Discharge: 2022-08-30 | Disposition: A | Discharge status: Home / Self Care | Payer: Commercial Managed Care - HMO | Source: Ambulatory Visit | Attending: Internal Medicine | Admitting: Internal Medicine

## 2022-08-30 ENCOUNTER — Ambulatory Visit: Admission: RE | Admit: 2022-08-30 | Payer: Commercial Managed Care - HMO | Source: Ambulatory Visit

## 2022-08-30 DIAGNOSIS — N6310 Unspecified lump in the right breast, unspecified quadrant: Secondary | ICD-10-CM

## 2022-10-10 ENCOUNTER — Ambulatory Visit: Payer: Commercial Managed Care - HMO | Admitting: Nurse Practitioner

## 2023-10-22 ENCOUNTER — Emergency Department (HOSPITAL_COMMUNITY): Payer: 59

## 2023-10-22 ENCOUNTER — Encounter (HOSPITAL_COMMUNITY): Payer: Self-pay

## 2023-10-22 ENCOUNTER — Emergency Department (HOSPITAL_COMMUNITY)
Admission: EM | Admit: 2023-10-22 | Discharge: 2023-10-22 | Disposition: A | Discharge status: Home / Self Care | Payer: 59 | Attending: Emergency Medicine | Admitting: Emergency Medicine

## 2023-10-22 DIAGNOSIS — I10 Essential (primary) hypertension: Secondary | ICD-10-CM | POA: Insufficient documentation

## 2023-10-22 DIAGNOSIS — X58XXXA Exposure to other specified factors, initial encounter: Secondary | ICD-10-CM | POA: Insufficient documentation

## 2023-10-22 DIAGNOSIS — S62623A Displaced fracture of medial phalanx of left middle finger, initial encounter for closed fracture: Secondary | ICD-10-CM | POA: Insufficient documentation

## 2023-10-22 DIAGNOSIS — Z7982 Long term (current) use of aspirin: Secondary | ICD-10-CM | POA: Insufficient documentation

## 2023-10-22 DIAGNOSIS — M79645 Pain in left finger(s): Secondary | ICD-10-CM | POA: Diagnosis not present

## 2023-10-22 MED ORDER — AMLODIPINE BESYLATE 5 MG PO TABS: 5.0000 mg | ORAL_TABLET | Freq: Every day | ORAL | 2 refills | Status: DC

## 2023-10-22 NOTE — Discharge Instructions (Addendum)

## 2023-10-22 NOTE — Progress Notes (Signed)
Orthopedic Tech Progress Note Patient Details:  Jack Salas 03-19-1971 [id number]  Ortho Devices Type of Ortho Device: Finger splint Ortho Device/Splint Location: LUE Ortho Device/Splint Interventions: Ordered, Application, Adjustment   Post Interventions Patient Tolerated: Well Instructions Provided: Care of device, Adjustment of device  [name] 10/22/2023, 3:20 PM
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
## 2024-12-10 ENCOUNTER — Emergency Department (HOSPITAL_COMMUNITY): Payer: Self-pay

## 2024-12-10 ENCOUNTER — Encounter (HOSPITAL_COMMUNITY): Payer: Self-pay

## 2024-12-10 ENCOUNTER — Emergency Department (HOSPITAL_COMMUNITY)
Admission: EM | Admit: 2024-12-10 | Discharge: 2024-12-10 | Disposition: A | Discharge status: Home / Self Care | Payer: Self-pay | Attending: Emergency Medicine | Admitting: Emergency Medicine

## 2024-12-10 DIAGNOSIS — I471 Supraventricular tachycardia, unspecified: Secondary | ICD-10-CM | POA: Insufficient documentation

## 2024-12-10 DIAGNOSIS — F17200 Nicotine dependence, unspecified, uncomplicated: Secondary | ICD-10-CM | POA: Insufficient documentation

## 2024-12-10 DIAGNOSIS — I2510 Atherosclerotic heart disease of native coronary artery without angina pectoris: Secondary | ICD-10-CM | POA: Insufficient documentation

## 2024-12-10 DIAGNOSIS — R778 Other specified abnormalities of plasma proteins: Secondary | ICD-10-CM | POA: Insufficient documentation

## 2024-12-10 DIAGNOSIS — Z7982 Long term (current) use of aspirin: Secondary | ICD-10-CM | POA: Insufficient documentation

## 2024-12-10 DIAGNOSIS — Z79899 Other long term (current) drug therapy: Secondary | ICD-10-CM | POA: Insufficient documentation

## 2024-12-10 LAB — CBC WITH DIFFERENTIAL/PLATELET
Abs Immature Granulocytes: 0.02 K/uL (ref 0.00–0.07)
Basophils Absolute: 0.1 K/uL (ref 0.0–0.1)
Basophils Relative: 1 %
Eosinophils Absolute: 0.2 K/uL (ref 0.0–0.5)
Eosinophils Relative: 2 %
HCT: 42.9 % (ref 39.0–52.0)
Hemoglobin: 13.8 g/dL (ref 13.0–17.0)
Immature Granulocytes: 0 %
Lymphocytes Relative: 47 %
Lymphs Abs: 4.5 K/uL — ABNORMAL HIGH (ref 0.7–4.0)
MCH: 24.9 pg — ABNORMAL LOW (ref 26.0–34.0)
MCHC: 32.2 g/dL (ref 30.0–36.0)
MCV: 77.3 fL — ABNORMAL LOW (ref 80.0–100.0)
Monocytes Absolute: 0.7 K/uL (ref 0.1–1.0)
Monocytes Relative: 8 %
Neutro Abs: 4 K/uL (ref 1.7–7.7)
Neutrophils Relative %: 42 %
Platelets: 367 K/uL (ref 150–400)
RBC: 5.55 MIL/uL (ref 4.22–5.81)
RDW: 15.9 % — ABNORMAL HIGH (ref 11.5–15.5)
WBC: 9.5 K/uL (ref 4.0–10.5)
nRBC: 0 % (ref 0.0–0.2)

## 2024-12-10 LAB — BASIC METABOLIC PANEL WITH GFR
Anion gap: 17 — ABNORMAL HIGH (ref 5–15)
BUN: 12 mg/dL (ref 6–20)
CO2: 19 mmol/L — ABNORMAL LOW (ref 22–32)
Calcium: 8.9 mg/dL (ref 8.9–10.3)
Chloride: 103 mmol/L (ref 98–111)
Creatinine, Ser: 1.16 mg/dL (ref 0.61–1.24)
GFR, Estimated: >60 mL/min
Glucose, Bld: 137 mg/dL — ABNORMAL HIGH (ref 70–99)
Potassium: 3.5 mmol/L (ref 3.5–5.1)
Sodium: 139 mmol/L (ref 135–145)

## 2024-12-10 LAB — TROPONIN T, HIGH SENSITIVITY: Troponin T High Sensitivity: 30 ng/L — ABNORMAL HIGH (ref 0–19)

## 2024-12-10 LAB — TSH: TSH: 1.83 u[IU]/mL (ref 0.350–4.500)

## 2024-12-10 LAB — MAGNESIUM: Magnesium: 1.9 mg/dL (ref 1.7–2.4)

## 2024-12-10 MED ORDER — ADENOSINE 6 MG/2ML IV SOLN
12.0000 mg | Freq: Once | INTRAVENOUS | Status: AC
  Administered 2024-12-10: 12 mg via INTRAVENOUS

## 2024-12-10 MED ORDER — SODIUM CHLORIDE 0.9 % IV BOLUS
1000.0000 mL | Freq: Once | INTRAVENOUS | Status: AC
  Administered 2024-12-10: 1000 mL via INTRAVENOUS

## 2024-12-10 MED ORDER — ADENOSINE 6 MG/2ML IV SOLN
6.0000 mg | Freq: Once | INTRAVENOUS | Status: AC
  Administered 2024-12-10: 6 mg via INTRAVENOUS

## 2024-12-10 NOTE — ED Triage Notes (Signed)
 Pt c/o CP that started while at work

## 2024-12-10 NOTE — ED Notes (Addendum)
 CCMD called by this RN

## 2024-12-10 NOTE — ED Provider Notes (Signed)
 " [place] EMERGENCY DEPARTMENT AT [facility] Provider Note   CSN: [id number] Arrival date & time: 12/10/24  9056     Patient presents with: No chief complaint on file.   Nelton Amsden is a 54 y.o. male.  He has a prior history of MI with stent.  He said he was at work around 8 PM today when he acutely felt weak with some chest discomfort.  He tried sitting down and resting but it did not seem to improve.  He ultimately drove here and presented to triage and was found to be in a narrow complex tachycardia going 200.  No prior history of arrhythmias.  He does smoke and drink although says not excessively.  Denies any cocaine or other stimulants.   The history is provided by the patient.  Chest Pain Pain location:  Substernal area Pain quality: aching   Pain radiates to:  Does not radiate Pain severity:  Moderate Onset quality:  Sudden Duration:  2 hours Timing:  Constant Progression:  Unchanged Chronicity:  New Associated symptoms: diaphoresis, dizziness, palpitations and shortness of breath   Associated symptoms: no abdominal pain, no cough, no fever, no nausea and no vomiting   Risk factors: coronary artery disease and smoking        Prior to Admission medications  Medication Sig Start Date End Date Taking? Authorizing Provider  amLODipine  (NORVASC ) 5 MG tablet Take 1 tablet (5 mg total) by mouth daily. 10/22/23   Dasie Faden, MD  aspirin  EC 81 MG tablet Take 81 mg by mouth daily. Swallow whole.    [provider]    Allergies: Patient has no known allergies.    Review of Systems  Constitutional:  Positive for diaphoresis. Negative for fever.  Eyes:  Negative for visual disturbance.  Respiratory:  Positive for shortness of breath. Negative for cough.   Cardiovascular:  Positive for chest pain and palpitations.  Gastrointestinal:  Negative for abdominal pain, nausea and vomiting.  Neurological:  Positive for dizziness.    Updated Vital Signs BP  112/77   Pulse 93   Temp (!) 97.5 F (36.4 C)   Resp 16   SpO2 100%   Physical Exam Vitals and nursing note reviewed.  Constitutional:      General: He is not in acute distress.    Appearance: Normal appearance. He is well-developed.  HENT:     Head: Normocephalic and atraumatic.  Eyes:     Conjunctiva/sclera: Conjunctivae normal.  Cardiovascular:     Rate and Rhythm: Regular rhythm. Tachycardia present.     Heart sounds: No murmur heard. Pulmonary:     Effort: Pulmonary effort is normal. No respiratory distress.     Breath sounds: Normal breath sounds.  Abdominal:     Palpations: Abdomen is soft.     Tenderness: There is no abdominal tenderness. There is no guarding or rebound.  Musculoskeletal:     Cervical back: Neck supple.     Right lower leg: No edema.     Left lower leg: No edema.  Skin:    General: Skin is warm and dry.     Capillary Refill: Capillary refill takes less than 2 seconds.  Neurological:     General: No focal deficit present.     Mental Status: He is alert and oriented to person, place, and time.     Cranial Nerves: No cranial nerve deficit.     Sensory: No sensory deficit.     Motor: No weakness.     (  all labs ordered are listed, but only abnormal results are displayed) Labs Reviewed  BASIC METABOLIC PANEL WITH GFR - Abnormal; Notable for the following components:      Result Value   CO2 19 (*)    Glucose, Bld 137 (*)    Anion gap 17 (*)    All other components within normal limits  CBC WITH DIFFERENTIAL/PLATELET - Abnormal; Notable for the following components:   MCV 77.3 (*)    MCH 24.9 (*)    RDW 15.9 (*)    Lymphs Abs 4.5 (*)    All other components within normal limits  TROPONIN T, HIGH SENSITIVITY - Abnormal; Notable for the following components:   Troponin T High Sensitivity 30 (*)    All other components within normal limits  TSH  MAGNESIUM  TROPONIN T, HIGH SENSITIVITY    EKG: EKG Interpretation Date/Time:  Wednesday  December 10 2024 10:11:27 EST Ventricular Rate:  114 PR Interval:  171 QRS Duration:  81 QT Interval:  328 QTC Calculation: 452 R Axis:   83  Text Interpretation: Sinus tachycardia Ventricular premature complex Low voltage, precordial leads Anteroseptal infarct, old Baseline wander in lead(s) II III aVL aVF V5 V6 after adenosine  12mg  Confirmed by Towana Sharper [phone]) on 12/10/2024 10:19:48 AM  Radiology: [facility] Chest Port 1 View Result Date: 12/10/2024 EXAM: 1 VIEW(S) XRAY OF THE CHEST 12/10/2024 10:38:00 AM COMPARISON: 10/18/2014 CLINICAL HISTORY: The patient presents with chest pain. FINDINGS: LINES, TUBES AND DEVICES: Defibrillator. LUNGS AND PLEURA: Low lung volumes. No focal pulmonary opacity. No pleural effusion. No pneumothorax. HEART AND MEDIASTINUM: No acute abnormality of the cardiac and mediastinal silhouettes. BONES AND SOFT TISSUES: Defibrillator pads over left chest. No acute osseous abnormality. IMPRESSION: 1. No acute cardiopulmonary abnormality. Electronically signed by: Rogelia Myers MD 12/10/2024 11:27 AM EST RP Workstation: [id number]     .Critical Care  Performed by: Towana Sharper BROCKS, MD Authorized by: Towana Sharper BROCKS, MD   Critical care provider statement:    Critical care time (minutes):  45   Critical care time was exclusive of:  Separately billable procedures and treating other patients   Critical care was necessary to treat or prevent imminent or life-threatening deterioration of the following conditions:  Cardiac failure and circulatory failure   Critical care was time spent personally by me on the following activities:  Development of treatment plan with patient or surrogate, discussions with consultants, evaluation of patient's response to treatment, examination of patient, obtaining history from patient or surrogate, ordering and performing treatments and interventions, ordering and review of laboratory studies, ordering and review of radiographic studies, pulse  oximetry, re-evaluation of patient's condition and review of old charts   I assumed direction of critical care for this patient from another provider in my specialty: no   .Cardioversion  Date/Time: 12/11/2024 8:39 AM  Performed by: Towana Sharper BROCKS, MD Authorized by: Towana Sharper BROCKS, MD   Consent:    Consent obtained:  Verbal   Consent given by:  Patient   Risks discussed:  Induced arrhythmia   Alternatives discussed:  Rate-control medication, delayed treatment and referral Pre-procedure details:    Cardioversion basis:  Emergent   Rhythm:  Supraventricular tachycardia Patient sedated: No Post-procedure details:    Patient status:  Awake   Patient tolerance of procedure:  Tolerated well, no immediate complications Comments:     Chemical cardioversion with 6 mg followed by 12 mg of adenosine  for SVT.  After 12 mg patient converted to sinus tachycardia and  finally normal sinus rhythm.  Tolerated well.     Medications Ordered in the ED  sodium chloride  0.9 % bolus 1,000 mL (1,000 mLs Intravenous New Bag/Given 12/10/24 1013)  adenosine  (ADENOCARD ) 6 MG/2ML injection 6 mg (6 mg Intravenous Given 12/10/24 1007)  adenosine  (ADENOCARD ) 6 MG/2ML injection 12 mg (12 mg Intravenous Given 12/10/24 1009)    Clinical Course as of 12/11/24 0839  Wed Dec 10, 2024  1018 Patient was placed on cardiac monitor.  He was given adenosine  6 without significant change in rate.  He was given adenosine  12 mg and converted to sinus rhythm.  Patient tolerated well. [MB]  1030 Patient looks much more comfortable heart rate in the 80s blood pressure improving.  No more chest pain.  Will continue to monitor. [MB]  1057 Chest x-ray interpreted by me as no acute infiltrate.  Awaiting radiology reading. [MB]  1129 Patient's lab work coming back fairly unremarkable other than a low bicarb and elevated troponin.  Do not feel he needs a second troponin as he is currently symptom-free now and likely reflects his  tachycardic event earlier.  He is comfortable plan for discharge.  I am putting in a referral for him to follow-up with our cardiology team.  Patient is comfortable plan for discharge [MB]    Clinical Course User Index [MB] Towana Ozell BROCKS, MD                                 Medical Decision Making Amount and/or Complexity of Data Reviewed Labs: ordered. Radiology: ordered.  Risk Prescription drug management.   This patient complains of chest discomfort weakness; this involves an extensive number of treatment Options and is a complaint that carries with it a high risk of complications and morbidity. The differential includes arrhythmia, ACS, dehydration, metabolic derangement  I ordered, reviewed and interpreted labs, which included CBC normal chemistries with low bicarb elevated glucose, troponins mildly elevated, TSH normal I ordered medication adenosine  for cardioversion and reviewed PMP when indicated. I ordered imaging studies which included chest x-ray and I independently    visualized and interpreted imaging which showed no acute findings Previous records obtained and reviewed in epic no recent admissions Cardiac monitoring reviewed, narrow complex tachycardia consistent with SVT Social determinants considered, no significant barriers Critical Interventions: Chemical cardioversion  After the interventions stated above, I reevaluated the patient and found patient to be asymptomatic back in sinus rhythm with no further chest pain Admission and further testing considered, no indications for admission.  Given cardiology referral.  Return instructions discussed.      Final diagnoses:  SVT (supraventricular tachycardia)    ED Discharge Orders          Ordered    Ambulatory referral to Cardiology       Comments: If you have not heard from the Cardiology office within the next 72 hours please call [phone].   12/10/24 1130               Towana Ozell BROCKS,  MD 12/11/24 [phone]  "

## 2024-12-18 NOTE — Progress Notes (Unsigned)
"   °  °  Cardiology Office Note Date:  12/18/2024  ID:  Jack Salas, DOB Dec 02, 1970, MRN [id number] PCP:  Patient, No Pcp Per  Cardiologist:  Joelle VEAR Ren Donley, MD  No chief complaint on file.    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  History of Present Illness     ROS: Please see the history of present illness. All other systems are reviewed and negative.   Past Medical History:  Diagnosis Date   Myocardial infarction [facility])    Tobacco abuse 02/14/2019    Past Surgical History:  Procedure Laterality Date   LEFT HEART CATHETERIZATION WITH CORONARY ANGIOGRAM N/A 10/18/2014   Procedure: LEFT HEART CATHETERIZATION WITH CORONARY ANGIOGRAM;  Surgeon: Rober LOISE Chroman, MD;  Location: [facility] CATH LAB;  Service: Cardiovascular;  Laterality: N/A;   PERCUTANEOUS CORONARY STENT INTERVENTION (PCI-S)  10/18/2014   Procedure: PERCUTANEOUS CORONARY STENT INTERVENTION (PCI-S);  Surgeon: Rober LOISE Chroman, MD;  Location: [facility] CATH LAB;  Service: Cardiovascular;;  prox LAD    Current Outpatient Medications  Medication Sig Dispense Refill   aspirin  EC 81 MG tablet Take 81 mg by mouth daily. Swallow whole.     No current facility-administered medications for this visit.    Allergies:   Patient has no known allergies.   Social History:  see above  Family History:  see above  PHYSICAL EXAM: VS:  There were no vitals taken for this visit. , BMI There is no height or weight on file to calculate BMI. GEN: Well nourished, well developed, in no acute distress HEENT: normal Neck: no JVD, carotid bruits, or masses Cardiac: ***RRR; no murmurs, rubs, or gallops,no edema  Respiratory:  CTAB bilaterally, normal work of  breathing GI: soft, nontender, nondistended, + BS Extremities: No LE edema Skin: warm and dry, no rash Neuro:  Strength and sensation are intact  EKG: ***  Recent Labs: Reviewed  Studies: Reviewed  Assessment and Plan Assessment & Plan      Signed, Joelle VEAR Ren Donley, MD  12/18/2024 11:18 AM    [facility] "

## 2024-12-23 ENCOUNTER — Ambulatory Visit

## 2024-12-23 DIAGNOSIS — F17200 Nicotine dependence, unspecified, uncomplicated: Secondary | ICD-10-CM

## 2024-12-23 DIAGNOSIS — I10 Essential (primary) hypertension: Secondary | ICD-10-CM

## 2024-12-23 DIAGNOSIS — I2510 Atherosclerotic heart disease of native coronary artery without angina pectoris: Secondary | ICD-10-CM

## 2024-12-23 DIAGNOSIS — I471 Supraventricular tachycardia, unspecified: Secondary | ICD-10-CM

## 2024-12-23 DIAGNOSIS — F1090 Alcohol use, unspecified, uncomplicated: Secondary | ICD-10-CM
